# Patient Record
Sex: Female | Born: 1990 | Race: White | Hispanic: No | Marital: Married | Smoking: Never smoker
Health system: Southern US, Community
[De-identification: ages and names within clinical notes are randomized; demographics above are authoritative.]

## PROBLEM LIST (undated history)

## (undated) ENCOUNTER — Inpatient Hospital Stay (HOSPITAL_COMMUNITY): Payer: Self-pay

## (undated) DIAGNOSIS — Z789 Other specified health status: Secondary | ICD-10-CM

## (undated) DIAGNOSIS — U071 COVID-19: Secondary | ICD-10-CM

## (undated) HISTORY — PX: NO PAST SURGERIES: SHX2092

---

## 2013-10-05 ENCOUNTER — Encounter (HOSPITAL_COMMUNITY): Payer: Self-pay | Admitting: Medical

## 2013-10-05 ENCOUNTER — Inpatient Hospital Stay (HOSPITAL_COMMUNITY)
Admission: AD | Admit: 2013-10-05 | Discharge: 2013-10-05 | Disposition: A | Discharge status: Home / Self Care | Payer: Medicaid Other | Source: Ambulatory Visit | Attending: Family Medicine | Admitting: Family Medicine

## 2013-10-05 DIAGNOSIS — O219 Vomiting of pregnancy, unspecified: Secondary | ICD-10-CM

## 2013-10-05 DIAGNOSIS — O210 Mild hyperemesis gravidarum: Secondary | ICD-10-CM | POA: Insufficient documentation

## 2013-10-05 HISTORY — DX: Other specified health status: Z78.9

## 2013-10-05 LAB — URINALYSIS, ROUTINE W REFLEX MICROSCOPIC
Bilirubin Urine: NEGATIVE
Glucose, UA: NEGATIVE mg/dL
Hgb urine dipstick: NEGATIVE
Ketones, ur: NEGATIVE mg/dL
LEUKOCYTES UA: NEGATIVE
Nitrite: NEGATIVE
PH: 6.5 (ref 5.0–8.0)
Protein, ur: NEGATIVE mg/dL
SPECIFIC GRAVITY, URINE: 1.015 (ref 1.005–1.030)
Urobilinogen, UA: 0.2 mg/dL (ref 0.0–1.0)

## 2013-10-05 LAB — POCT PREGNANCY, URINE: Preg Test, Ur: POSITIVE — AB

## 2013-10-05 MED ORDER — PROMETHAZINE HCL 25 MG/ML IJ SOLN
12.5000 mg | Freq: Once | INTRAMUSCULAR | Status: AC
  Administered 2013-10-05: 12.5 mg via INTRAMUSCULAR
  Filled 2013-10-05: qty 1

## 2013-10-05 MED ORDER — PROMETHAZINE HCL 25 MG PO TABS: 25.0000 mg | ORAL_TABLET | Freq: Four times a day (QID) | ORAL | Status: DC | PRN

## 2013-10-05 NOTE — MAU Provider Note (Signed)
History     CSN: [id number]  Arrival date and time: 10/05/13 1141   First Provider Initiated Contact with Patient 10/05/13 1215      Chief Complaint  Patient presents with  . Emesis During Pregnancy   HPI Comments: [name] 23 y.o. G1P0 [redacted]w[redacted]d presents to MAU for nausea and vomiting x 3 weeks. She had a little bleeding from her throat when she vomited today. She has not had any vaginal bleeding or cramping. She has not lost any weight but gained 3 lbs. Her diet is [place] and rather spicy. She plans care at [facility]. She has been in this country for only 3 months and speaks Arabic. Her husband interprets for her.      Past Medical History  Diagnosis Date  . Medical history non-contributory     Past Surgical History  Procedure Laterality Date  . No past surgeries      History reviewed. No pertinent family history.  History  Substance Use Topics  . Smoking status: Never Smoker   . Smokeless tobacco: Not on file  . Alcohol Use: No    Allergies: No Known Allergies  Prescriptions prior to admission  Medication Sig Dispense Refill  . Prenatal Vit-Fe Fumarate-FA (PRENATAL MULTIVITAMIN) TABS tablet Take 1 tablet by mouth daily at 12 noon.        Review of Systems  Constitutional: Negative.   HENT: Negative.   Respiratory: Negative.   Cardiovascular: Negative.   Gastrointestinal: Positive for heartburn, nausea and vomiting. Negative for abdominal pain.  Genitourinary: Negative.   Skin: Negative.   Neurological: Negative.   Psychiatric/Behavioral: Negative.    Physical Exam   Blood pressure 99/57, pulse 69, temperature 98.2 F (36.8 C), temperature source Oral, resp. rate 16, height 5' 1.75" (1.568 m), weight 55.883 kg (123 lb 3.2 oz), last menstrual period 08/12/2013, SpO2 99.00%.  Physical Exam  Constitutional: She is oriented to person, place, and time. She appears well-developed and well-nourished. No distress.  HENT:  Head: Normocephalic and atraumatic.   Eyes: Pupils are equal, round, and reactive to light.  Neck: Normal range of motion. Neck supple.  Cardiovascular: Normal rate, regular rhythm and normal heart sounds.   Respiratory: Effort normal and breath sounds normal.  GI: Soft. Bowel sounds are normal.  Genitourinary:  Not examined  Musculoskeletal: Normal range of motion.  Neurological: She is alert and oriented to person, place, and time.  Skin: Skin is warm and dry.  Psychiatric: She has a normal mood and affect. Her behavior is normal. Judgment and thought content normal.   Results for orders placed during the hospital encounter of 10/05/13 (from the past 24 hour(s))  URINALYSIS, ROUTINE W REFLEX MICROSCOPIC     Status: None   Collection Time    10/05/13 11:50 AM      Result Value Ref Range   Color, Urine YELLOW  YELLOW   APPearance CLEAR  CLEAR   Specific Gravity, Urine 1.015  1.005 - 1.030   pH 6.5  5.0 - 8.0   Glucose, UA NEGATIVE  NEGATIVE mg/dL   Hgb urine dipstick NEGATIVE  NEGATIVE   Bilirubin Urine NEGATIVE  NEGATIVE   Ketones, ur NEGATIVE  NEGATIVE mg/dL   Protein, ur NEGATIVE  NEGATIVE mg/dL   Urobilinogen, UA 0.2  0.0 - 1.0 mg/dL   Nitrite NEGATIVE  NEGATIVE   Leukocytes, UA NEGATIVE  NEGATIVE  POCT PREGNANCY, URINE     Status: Abnormal   Collection Time    10/05/13 12:28 PM  Result Value Ref Range   Preg Test, Ur POSITIVE (*) NEGATIVE    MAU Course  Procedures  MDM  Phenergan 12.5 mg IM/ feeling much better  Assessment and Plan   A: Nausea and vomiting in early pregnancy  P: Phenergan 25 mg 1/2 tab q 6 hrs prn nausea  Decrease spicy foods  Use Flintstones for PNV till nausea passes Call [facility] for prenatal care   Carolynn Serve 10/05/2013, 12:40 PM

## 2013-10-05 NOTE — MAU Note (Signed)
No vaginal bleeding at all.  Pt has been having vomiting with pregnancy x 2 weeks.

## 2013-10-05 NOTE — Discharge Instructions (Signed)
Hyperemesis Gravidarum °Hyperemesis gravidarum is a severe form of nausea and vomiting that happens during pregnancy. Hyperemesis is worse than morning sickness. It may cause you to have nausea or vomiting all day for many days. It may keep you from eating and drinking enough food and liquids. Hyperemesis usually occurs during the first half (the first 20 weeks) of pregnancy. It often goes away once a woman is in her second half of pregnancy. However, sometimes hyperemesis continues through an entire pregnancy.  °CAUSES  °The cause of this condition is not completely known but is thought to be related to changes in the body's hormones when pregnant. It could be from the high level of the pregnancy hormone or an increase in estrogen in the body.  °SIGNS AND SYMPTOMS  °· Severe nausea and vomiting. °· Nausea that does not go away. °· Vomiting that does not allow you to keep any food down. °· Weight loss and body fluid loss (dehydration). °· Having no desire to eat or not liking food you have previously enjoyed. °DIAGNOSIS  °Your health care provider will do a physical exam and ask you about your symptoms. He or she may also order blood tests and urine tests to make sure something else is not causing the problem.  °TREATMENT  °You may only need medicine to control the problem. If medicines do not control the nausea and vomiting, you will be treated in the hospital to prevent dehydration, increased acid in the blood (acidosis), weight loss, and changes in the electrolytes in your body that may harm the unborn baby (fetus). You may need IV fluids.  °HOME CARE INSTRUCTIONS  °· Only take over-the-counter or prescription medicines as directed by your health care provider. °· Try eating a couple of dry crackers or toast in the morning before getting out of bed. °· Avoid foods and smells that upset your stomach. °· Avoid fatty and spicy foods. °· Eat 5-6 small meals a day. °· Do not drink when eating meals. Drink between  meals. °· For snacks, eat high-protein foods, such as cheese. °· Eat or suck on things that have ginger in them. Ginger helps nausea. °· Avoid food preparation. The smell of food can spoil your appetite. °· Avoid iron pills and iron in your multivitamins until after 3-4 months of being pregnant. However, consult with your health care provider before stopping any prescribed iron pills. °SEEK MEDICAL CARE IF:  °· Your abdominal pain increases. °· You have a severe headache. °· You have vision problems. °· You are losing weight. °SEEK IMMEDIATE MEDICAL CARE IF:  °· You are unable to keep fluids down. °· You vomit blood. °· You have constant nausea and vomiting. °· You have excessive weakness. °· You have extreme thirst. °· You have dizziness or fainting. °· You have a fever or persistent symptoms for more than 2-3 days. °· You have a fever and your symptoms suddenly get worse. °MAKE SURE YOU:  °· Understand these instructions. °· Will watch your condition. °· Will get help right away if you are not doing well or get worse. °Document Released: 01/18/2005 Document Revised: 11/08/2012 Document Reviewed: 08/30/2012 °ExitCare® Patient Information ©2015 [employer]. This information is not intended to replace advice given to you by your health care provider. Make sure you discuss any questions you have with your health care provider. ° °

## 2013-10-06 NOTE — MAU Provider Note (Signed)
Attestation of Attending Supervision of Advanced Practitioner (CNM/NP): Evaluation and management procedures were performed by the Advanced Practitioner under my supervision and collaboration. I have reviewed the Advanced Practitioner's note and chart, and I agree with the management and plan.  LEGGETT,KELLY H. 2:00 AM

## 2013-10-30 LAB — OB RESULTS CONSOLE HEPATITIS B SURFACE ANTIGEN: Hepatitis B Surface Ag: NEGATIVE

## 2013-10-30 LAB — OB RESULTS CONSOLE GC/CHLAMYDIA
Chlamydia: NEGATIVE
Gonorrhea: NEGATIVE

## 2013-10-30 LAB — OB RESULTS CONSOLE HIV ANTIBODY (ROUTINE TESTING): HIV: NONREACTIVE

## 2013-10-30 LAB — OB RESULTS CONSOLE ABO/RH: RH Type: POSITIVE

## 2013-10-30 LAB — OB RESULTS CONSOLE RUBELLA ANTIBODY, IGM: Rubella: IMMUNE

## 2013-10-30 LAB — OB RESULTS CONSOLE ANTIBODY SCREEN: Antibody Screen: NEGATIVE

## 2013-10-30 LAB — OB RESULTS CONSOLE RPR: RPR: NONREACTIVE

## 2013-12-04 ENCOUNTER — Encounter (HOSPITAL_COMMUNITY): Payer: Self-pay | Admitting: Medical

## 2014-02-01 NOTE — L&D Delivery Note (Signed)
Delivery Note At 12:17 PM a viable and healthy female was delivered via  (Presentation: ROA ; ROT - compound with Left hand  ).  APGAR: 9,9 ; weight 7#2 .   Placenta status: delivered, intact .  Cord: 3VC with the following complications: none .    Anesthesia: Epidural  Episiotomy:  none Lacerations:  2nd degree, periclitoral Suture Repair: 3.0 vicryl rapide Est. Blood Loss (mL):  400cc  Mom to postpartum.  Baby to Couplet care / Skin to Skin.  Bovard-Stuckert, Amro Winebarger 05/11/2014, 12:37 PM  Br/O+/RI/Tdap in PNC/Contra?  Will d/w pt circumcision with interpreter.

## 2014-04-15 LAB — OB RESULTS CONSOLE GBS: GBS: NEGATIVE

## 2014-05-09 ENCOUNTER — Encounter (HOSPITAL_COMMUNITY): Payer: Self-pay | Admitting: *Deleted

## 2014-05-09 ENCOUNTER — Telehealth (HOSPITAL_COMMUNITY): Payer: Self-pay | Admitting: *Deleted

## 2014-05-09 NOTE — Telephone Encounter (Signed)
Interpreter number [phone]

## 2014-05-09 NOTE — Telephone Encounter (Signed)
Preadmission screen Interpreter number  

## 2014-05-11 ENCOUNTER — Inpatient Hospital Stay (HOSPITAL_COMMUNITY): Payer: Medicaid Other | Admitting: Anesthesiology

## 2014-05-11 ENCOUNTER — Encounter (HOSPITAL_COMMUNITY): Payer: Self-pay | Admitting: *Deleted

## 2014-05-11 ENCOUNTER — Inpatient Hospital Stay (HOSPITAL_COMMUNITY)
Admission: AD | Admit: 2014-05-11 | Discharge: 2014-05-13 | DRG: 775 | Disposition: A | Discharge status: Home / Self Care | Payer: Medicaid Other | Source: Ambulatory Visit | Attending: Obstetrics and Gynecology | Admitting: Obstetrics and Gynecology

## 2014-05-11 DIAGNOSIS — Z3A39 39 weeks gestation of pregnancy: Secondary | ICD-10-CM | POA: Diagnosis present

## 2014-05-11 DIAGNOSIS — O42013 Preterm premature rupture of membranes, onset of labor within 24 hours of rupture, third trimester: Secondary | ICD-10-CM | POA: Diagnosis present

## 2014-05-11 DIAGNOSIS — O9989 Other specified diseases and conditions complicating pregnancy, childbirth and the puerperium: Secondary | ICD-10-CM | POA: Diagnosis present

## 2014-05-11 LAB — CBC
HCT: 34.4 % — ABNORMAL LOW (ref 36.0–46.0)
Hemoglobin: 12.2 g/dL (ref 12.0–15.0)
MCH: 31.8 pg (ref 26.0–34.0)
MCHC: 35.5 g/dL (ref 30.0–36.0)
MCV: 89.6 fL (ref 78.0–100.0)
Platelets: 204 10*3/uL (ref 150–400)
RBC: 3.84 MIL/uL — ABNORMAL LOW (ref 3.87–5.11)
RDW: 13.1 % (ref 11.5–15.5)
WBC: 12.4 10*3/uL — AB (ref 4.0–10.5)

## 2014-05-11 LAB — TYPE AND SCREEN
ABO/RH(D): O POS
ANTIBODY SCREEN: NEGATIVE

## 2014-05-11 LAB — RPR: RPR Ser Ql: NONREACTIVE

## 2014-05-11 LAB — ABO/RH: ABO/RH(D): O POS

## 2014-05-11 MED ORDER — LACTATED RINGERS IV SOLN
500.0000 mL | Freq: Once | INTRAVENOUS | Status: AC
  Administered 2014-05-11: 500 mL via INTRAVENOUS

## 2014-05-11 MED ORDER — ACETAMINOPHEN 325 MG PO TABS: 650.0000 mg | ORAL_TABLET | ORAL | Status: DC | PRN

## 2014-05-11 MED ORDER — LACTATED RINGERS IV SOLN: INTRAVENOUS | Status: DC

## 2014-05-11 MED ORDER — LIDOCAINE HCL (PF) 1 % IJ SOLN
30.0000 mL | INTRAMUSCULAR | Status: DC | PRN
  Filled 2014-05-11: qty 30

## 2014-05-11 MED ORDER — OXYCODONE-ACETAMINOPHEN 5-325 MG PO TABS: 2.0000 | ORAL_TABLET | ORAL | Status: DC | PRN

## 2014-05-11 MED ORDER — BENZOCAINE-MENTHOL 20-0.5 % EX AERO
1.0000 "application " | INHALATION_SPRAY | CUTANEOUS | Status: DC | PRN
  Administered 2014-05-12 – 2014-05-13 (×2): 1 via TOPICAL
  Filled 2014-05-11 (×2): qty 56

## 2014-05-11 MED ORDER — OXYCODONE-ACETAMINOPHEN 5-325 MG PO TABS: 1.0000 | ORAL_TABLET | ORAL | Status: DC | PRN

## 2014-05-11 MED ORDER — FENTANYL 2.5 MCG/ML BUPIVACAINE 1/10 % EPIDURAL INFUSION (WH - ANES)
INTRAMUSCULAR | Status: DC | PRN
  Administered 2014-05-11: 14 mL/h via EPIDURAL

## 2014-05-11 MED ORDER — CITRIC ACID-SODIUM CITRATE 334-500 MG/5ML PO SOLN
30.0000 mL | ORAL | Status: DC | PRN
  Filled 2014-05-11: qty 15

## 2014-05-11 MED ORDER — ONDANSETRON HCL 4 MG PO TABS: 4.0000 mg | ORAL_TABLET | ORAL | Status: DC | PRN

## 2014-05-11 MED ORDER — LIDOCAINE HCL (PF) 1 % IJ SOLN
INTRAMUSCULAR | Status: DC | PRN
  Administered 2014-05-11 (×2): 5 mL

## 2014-05-11 MED ORDER — LANOLIN HYDROUS EX OINT: TOPICAL_OINTMENT | CUTANEOUS | Status: DC | PRN

## 2014-05-11 MED ORDER — SIMETHICONE 80 MG PO CHEW
80.0000 mg | CHEWABLE_TABLET | ORAL | Status: DC | PRN
  Administered 2014-05-11: 80 mg via ORAL

## 2014-05-11 MED ORDER — ZOLPIDEM TARTRATE 5 MG PO TABS: 5.0000 mg | ORAL_TABLET | Freq: Every evening | ORAL | Status: DC | PRN

## 2014-05-11 MED ORDER — PHENYLEPHRINE 40 MCG/ML (10ML) SYRINGE FOR IV PUSH (FOR BLOOD PRESSURE SUPPORT)
80.0000 ug | PREFILLED_SYRINGE | INTRAVENOUS | Status: DC | PRN
  Filled 2014-05-11: qty 2

## 2014-05-11 MED ORDER — OXYTOCIN BOLUS FROM INFUSION: 500.0000 mL | INTRAVENOUS | Status: DC

## 2014-05-11 MED ORDER — ONDANSETRON HCL 4 MG/2ML IJ SOLN: 4.0000 mg | INTRAMUSCULAR | Status: DC | PRN

## 2014-05-11 MED ORDER — ONDANSETRON HCL 4 MG/2ML IJ SOLN
4.0000 mg | Freq: Four times a day (QID) | INTRAMUSCULAR | Status: DC | PRN
Start: 2014-05-11 — End: 2014-05-11

## 2014-05-11 MED ORDER — DIPHENHYDRAMINE HCL 25 MG PO CAPS: 25.0000 mg | ORAL_CAPSULE | Freq: Four times a day (QID) | ORAL | Status: DC | PRN

## 2014-05-11 MED ORDER — EPHEDRINE 5 MG/ML INJ
10.0000 mg | INTRAVENOUS | Status: DC | PRN
  Filled 2014-05-11: qty 2

## 2014-05-11 MED ORDER — BUTORPHANOL TARTRATE 1 MG/ML IJ SOLN
1.0000 mg | INTRAMUSCULAR | Status: DC | PRN
  Administered 2014-05-11: 1 mg via INTRAVENOUS
  Filled 2014-05-11: qty 1

## 2014-05-11 MED ORDER — FENTANYL 2.5 MCG/ML BUPIVACAINE 1/10 % EPIDURAL INFUSION (WH - ANES)
14.0000 mL/h | INTRAMUSCULAR | Status: DC | PRN
  Administered 2014-05-11: 14 mL/h via EPIDURAL
  Filled 2014-05-11: qty 125

## 2014-05-11 MED ORDER — LACTATED RINGERS IV SOLN: 500.0000 mL | INTRAVENOUS | Status: DC | PRN

## 2014-05-11 MED ORDER — DIPHENHYDRAMINE HCL 50 MG/ML IJ SOLN: 12.5000 mg | INTRAMUSCULAR | Status: DC | PRN

## 2014-05-11 MED ORDER — DIBUCAINE 1 % RE OINT: 1.0000 "application " | TOPICAL_OINTMENT | RECTAL | Status: DC | PRN

## 2014-05-11 MED ORDER — PRENATAL MULTIVITAMIN CH
1.0000 | ORAL_TABLET | Freq: Every day | ORAL | Status: DC
  Administered 2014-05-12 – 2014-05-13 (×2): 1 via ORAL
  Filled 2014-05-11 (×2): qty 1

## 2014-05-11 MED ORDER — PHENYLEPHRINE 40 MCG/ML (10ML) SYRINGE FOR IV PUSH (FOR BLOOD PRESSURE SUPPORT)
80.0000 ug | PREFILLED_SYRINGE | INTRAVENOUS | Status: DC | PRN
  Filled 2014-05-11: qty 2
  Filled 2014-05-11: qty 20

## 2014-05-11 MED ORDER — FLEET ENEMA 7-19 GM/118ML RE ENEM: 1.0000 | ENEMA | RECTAL | Status: DC | PRN

## 2014-05-11 MED ORDER — TERBUTALINE SULFATE 1 MG/ML IJ SOLN
0.2500 mg | Freq: Once | INTRAMUSCULAR | Status: DC | PRN
  Filled 2014-05-11: qty 1

## 2014-05-11 MED ORDER — SENNOSIDES-DOCUSATE SODIUM 8.6-50 MG PO TABS
2.0000 | ORAL_TABLET | ORAL | Status: DC
  Administered 2014-05-11 – 2014-05-13 (×2): 2 via ORAL
  Filled 2014-05-11 (×2): qty 2

## 2014-05-11 MED ORDER — OXYTOCIN 40 UNITS IN LACTATED RINGERS INFUSION - SIMPLE MED
1.0000 m[IU]/min | INTRAVENOUS | Status: DC
  Administered 2014-05-11: 2 m[IU]/min via INTRAVENOUS
  Filled 2014-05-11: qty 1000

## 2014-05-11 MED ORDER — IBUPROFEN 600 MG PO TABS
600.0000 mg | ORAL_TABLET | Freq: Four times a day (QID) | ORAL | Status: DC
  Administered 2014-05-11 – 2014-05-13 (×7): 600 mg via ORAL
  Filled 2014-05-11 (×8): qty 1

## 2014-05-11 MED ORDER — LACTATED RINGERS IV SOLN
INTRAVENOUS | Status: DC
  Administered 2014-05-11 (×3): via INTRAVENOUS

## 2014-05-11 MED ORDER — WITCH HAZEL-GLYCERIN EX PADS: 1.0000 "application " | MEDICATED_PAD | CUTANEOUS | Status: DC | PRN

## 2014-05-11 MED ORDER — OXYTOCIN 40 UNITS IN LACTATED RINGERS INFUSION - SIMPLE MED
62.5000 mL/h | INTRAVENOUS | Status: DC
  Administered 2014-05-11: 62.5 mL/h via INTRAVENOUS

## 2014-05-11 NOTE — Progress Notes (Signed)
[facility] to BS via w/c for further eval of SROM.

## 2014-05-11 NOTE — MAU Note (Signed)
Leaking fld since Thurs night. More today and esp last 4 hours. Like water with some blood in it. Some mild contractions

## 2014-05-11 NOTE — H&P (Signed)
Valerie Holmes is a 24 y.o. female G1P0  At 39+ with ROM for copious clear fluid.  +FM,  LOF - clear fluid, no VB,  occ ctx.  Relatively uncomplicated PNC.  Language barrier  Maternal Medical History:  Reason for admission: Rupture of membranes.   Contractions: Frequency: irregular.    Fetal activity: Perceived fetal activity is normal.    Prenatal complications: no prenatal complications Prenatal Complications - Diabetes: none.    OB History    Gravida Para Term Preterm AB TAB SAB Ectopic Multiple Living   1         0    G1 present No abn pap No STD  Past Medical History  Diagnosis Date  . Medical history non-contributory    Past Surgical History  Procedure Laterality Date  . No past surgeries     Family History: family history is negative for Alcohol abuse, Arthritis, Asthma, Birth defects, Cancer, COPD, Depression, Diabetes, Drug abuse, Early death, Hearing loss, Heart disease, Hyperlipidemia, Hypertension, Kidney disease, Learning disabilities, Mental illness, Mental retardation, Miscarriages / Stillbirths, Stroke, Vision loss, and Varicose Veins. Social History:  reports that she has never smoked. She has never used smokeless tobacco. She reports that she does not drink alcohol or use illicit drugs.married Meds PNV All NKDA   Prenatal Transfer Tool  Maternal Diabetes: No Genetic Screening: Normal Maternal Ultrasounds/Referrals: Normal Fetal Ultrasounds or other Referrals:  None Maternal Substance Abuse:  No Significant Maternal Medications:  None Significant Maternal Lab Results:  Lab values include: Group B Strep negative Other Comments:  language barrier  Review of Systems  Constitutional: Negative.   HENT: Negative.   Eyes: Negative.   Respiratory: Negative.   Cardiovascular: Negative.   Gastrointestinal: Negative.   Genitourinary: Negative.   Musculoskeletal: Negative.   Skin: Negative.   Neurological: Negative.   Psychiatric/Behavioral: Negative.      Dilation: 4.5 Effacement (%): 90 Station: -1 Exam by:: m wilkins rnc Blood pressure 116/63, pulse 75, temperature 98.7 F (37.1 C), temperature source Oral, resp. rate 18, height 5' 2.5" (1.588 m), weight 73.12 kg (161 lb 3.2 oz), last menstrual period 08/12/2013. Maternal Exam:  Abdomen: Patient reports no abdominal tenderness. Fundal height is appropriate for gestation.   Estimated fetal weight is 6-7#.   Fetal presentation: vertex  Introitus: Normal vulva. Normal vagina.  Pelvis: adequate for delivery.   Cervix: Cervix evaluated by digital exam.     Physical Exam  Constitutional: She is oriented to person, place, and time. She appears well-developed and well-nourished.  HENT:  Head: Normocephalic and atraumatic.  Cardiovascular: Normal rate and regular rhythm.   Respiratory: Effort normal and breath sounds normal. No respiratory distress. She has no wheezes.  GI: Soft. Bowel sounds are normal. She exhibits no distension. There is no tenderness.  Musculoskeletal: Normal range of motion.  Neurological: She is alert and oriented to person, place, and time.  Skin: Skin is warm and dry.  Psychiatric: She has a normal mood and affect. Her behavior is normal.    Prenatal labs: ABO, Rh: --/--/O POS, O POS (04/09 0140) Antibody: NEG (04/09 0140) Rubella: Immune (09/29 0000) RPR: Nonreactive (09/29 0000)  HBsAg: Negative (09/29 0000)  HIV: Non-reactive (09/29 0000)  GBS: Negative (03/14 0000)   Hgb 13.3/Plt 191K/Ur Cx neg/ GC neg/Chl neg/Pap WNL/CF neg/ First Tri and AFP WNL/glucola 84/GBBS neg  [place] dated by 12 wk [place] nl anat/post plac/female  Tdap 02/19/14, Flu 11/27/13  Assessment/Plan: 24yo G1P0 at 39+ with ROM,  augmentation of labor with pitocin Expect SVD gbbs neg   Bovard-Stuckert, Chia Rock 05/11/2014, 7:58 AM

## 2014-05-11 NOTE — Progress Notes (Signed)
Patient ID: Valerie Holmes, female   DOB: 02/01/1991, 24 y.o.   MRN: [id number]  CTSP secondary to repetitive/prolonged variables.  SVE 5-6/90/-1  IUPC placed without complication for possible amniinfusion  140-150 mod var, category 2, close monitoring Ctx 2-844min mVu not appearing adequate  Will monitor closely

## 2014-05-11 NOTE — Lactation Note (Signed)
This note was copied from the chart of Valerie Holmes. Lactation Consultation Note  Patient Name: Valerie Atilano Inal Yariana Today's Date: 05/11/2014 Reason for consult: Initial assessment of this mom and baby at 5 hours pp.This is mom's first baby and she speaks Arabic but FOB and a female friend (experienced breastfeeder) are at bedside and able to translate from [place] to Arabic.  Baby is asleep in open crib on his back with no cuing and mom has room full of visitors right now.  LC discussed cue feedings, importance of frequent STS and hand expression.  Mom and FOB verify that their nurse has shown hand expression and the female friend reassures her that colostrum is sufficient for newborn. LC informs her that newborn sleepiness is normal during first few days.  LC recommends unwrapping baby at least q3h and attempting to breastfeed with STS, which may awaken baby and the placing STS will stimulate maternal milk-producing hormones.  Mom encouraged to feed baby 8-12 times/24 hours and with feeding cues. LC encouraged review of Baby and Me pp 9, 14 and 20-25 for STS and BF information. LC provided [facility] Resource brochure and reviewed [facility] services and list of community and web site resources.    Maternal Data Formula Feeding for Exclusion: No Has patient been taught Hand Expression?: Yes (mom states that her nurse has shown her how to hand express milk)  Feeding Feeding Type: Breast Fed  LATCH Score/Interventions           no LATCH yet but several attempts since birth; baby sleepy           Lactation Tools Discussed/Used   STS, cue feedings, hand expression  Consult Status Consult Status: Follow-up Date: 05/12/14 Follow-up type: In-patient    Warrick ParisianBryant, Jamelah Sitzer [facility] 05/11/2014, 5:27 PM

## 2014-05-11 NOTE — Anesthesia Procedure Notes (Signed)
Epidural Patient location during procedure: OB  Staffing Anesthesiologist: Sebastian AcheMANNY, Codi Folkerts Performed by: anesthesiologist   Preanesthetic Checklist Completed: patient identified, site marked, surgical consent, pre-op evaluation, timeout performed, IV checked, risks and benefits discussed and monitors and equipment checked  Epidural Patient position: sitting Prep: DuraPrep Patient monitoring: heart rate, continuous pulse ox and blood pressure Approach: midline Location: L2-L3 Injection technique: LOR air  Needle:  Needle type: Tuohy  Needle gauge: 18 G Needle length: 9 cm and 9 Needle insertion depth: 6 cm Catheter type: closed end flexible Catheter size: 20 Guage Test dose: negative  Assessment Events: blood not aspirated, injection not painful, no injection resistance, negative IV test and no paresthesia  Additional Notes   Patient tolerated the insertion well without complications.Reason for block:procedure for pain

## 2014-05-11 NOTE — Anesthesia Preprocedure Evaluation (Signed)
Anesthesia Evaluation  Patient identified by MRN, date of birth, ID band Patient awake and Patient confused    Reviewed: Allergy & Precautions, H&P , NPO status , Patient's Chart, lab work & pertinent test results  Airway Mallampati: II       Dental  (+) Teeth Intact   Pulmonary neg pulmonary ROS,  breath sounds clear to auscultation  Pulmonary exam normal       Cardiovascular Exercise Tolerance: Good Rhythm:regular Rate:Normal     Neuro/Psych    GI/Hepatic negative GI ROS, Neg liver ROS,   Endo/Other    Renal/GU      Musculoskeletal   Abdominal Normal abdominal exam  (+)   Peds  Hematology   Anesthesia Other Findings   Reproductive/Obstetrics (+) Pregnancy                             Anesthesia Physical Anesthesia Plan  ASA: II  Anesthesia Plan: Epidural   Post-op Pain Management:    Induction:   Airway Management Planned:   Additional Equipment:   Intra-op Plan:   Post-operative Plan:   Informed Consent: I have reviewed the patients History and Physical, chart, labs and discussed the procedure including the risks, benefits and alternatives for the proposed anesthesia with the patient or authorized representative who has indicated his/her understanding and acceptance.     Plan Discussed with:   Anesthesia Plan Comments:         Anesthesia Quick Evaluation

## 2014-05-12 LAB — CBC
HCT: 30.9 % — ABNORMAL LOW (ref 36.0–46.0)
Hemoglobin: 10.7 g/dL — ABNORMAL LOW (ref 12.0–15.0)
MCH: 31.8 pg (ref 26.0–34.0)
MCHC: 34.6 g/dL (ref 30.0–36.0)
MCV: 92 fL (ref 78.0–100.0)
Platelets: 166 10*3/uL (ref 150–400)
RBC: 3.36 MIL/uL — ABNORMAL LOW (ref 3.87–5.11)
RDW: 13.5 % (ref 11.5–15.5)
WBC: 15.4 10*3/uL — ABNORMAL HIGH (ref 4.0–10.5)

## 2014-05-12 NOTE — Progress Notes (Signed)
Post Partum Day 1 Subjective: no complaints, up ad lib, voiding, tolerating PO and nl lochia, pain controlled  Objective: Blood pressure 110/55, pulse 79, temperature 98.2 F (36.8 C), temperature source Oral, resp. rate 20, height 5' 2.5" (1.588 m), weight 73.12 kg (161 lb 3.2 oz), last menstrual period 08/12/2013, unknown if currently breastfeeding.  Physical Exam:  General: alert and no distress Lochia: appropriate Uterine Fundus: firm}   Recent Labs  05/11/14 0140 05/12/14 0608  HGB 12.2 10.7*  HCT 34.4* 30.9*    Assessment/Plan: Plan for discharge tomorrow.  Routine postpartum care.     LOS: 1 day   Bovard-Stuckert, Tzipora Mcinroy 05/12/2014, 8:43 AM

## 2014-05-12 NOTE — Anesthesia Postprocedure Evaluation (Signed)
Anesthesia Post Note  Patient: Valerie Holmes  Procedure(s) Performed: * No procedures listed *  Anesthesia type: Epidural  Patient location: Mother/Baby  Post pain: Pain level controlled  Post assessment: Post-op Vital signs reviewed  Last Vitals:  Filed Vitals:   05/12/14 0611  BP: 110/55  Pulse: 79  Temp: 36.8 C  Resp: 20    Post vital signs: Reviewed  Level of consciousness:alert  Complications: No apparent anesthesia complications

## 2014-05-12 NOTE — Lactation Note (Addendum)
This note was copied from the chart of Valerie Holmes. Lactation Consultation Note  Marcelino DusterMichelle RN asked for assistance w/ breastfeeding since baby has had short feedings and has had difficulty maintaining latch.  FOB interpreting.  Language arabic. Undressed baby and encouraged lots of STS.   Hand expressed a few drops of colostrom into spoon and gave to baby. Repositioned mother to side lying.  Reviewed how to massage breasts during feeding. Baby latched, sucks and swallows observed, some with stimulation.  Parents happy. Encouraged longer feedings, more than 10 min. Reminded them that when he slows down on one breast or falls asleep, burp him and feed on other breast. Observed feeding for more than 15 min. Reviewed w/ parents the importance of STS, undressing baby and hand expressing before latching. Michelle RN set up DEBP since baby has only been feeding short periods. Suggest if feeding difficulty continues mother should use DEBP.    Patient Name: [name]'XToday's Date: 05/12/2014 Reason for consult: Follow-up assessment   Maternal Data    Feeding Feeding Type: Breast Fed Length of feed: 5 min  LATCH Score/Interventions Latch: Repeated attempts needed to sustain latch, nipple held in mouth throughout feeding, stimulation needed to elicit sucking reflex. Intervention(s): Waking techniques;Skin to skin Intervention(s): Breast massage;Assist with latch;Adjust position  Audible Swallowing: A few with stimulation Intervention(s): Skin to skin;Hand expression  Type of Nipple: Everted at rest and after stimulation  Comfort (Breast/Nipple): Soft / non-tender     Hold (Positioning): Assistance needed to correctly position infant at breast and maintain latch. Intervention(s): Support Pillows;Position options;Skin to skin;Breastfeeding basics reviewed  LATCH Score: 7  Lactation Tools Discussed/Used Tools: Pump Breast pump type: Manual   Consult Status Consult  Status: Follow-up Date: 05/13/14 Follow-up type: In-patient    Dahlia ByesBerkelhammer, Girolamo Lortie [facility] 05/12/2014, 2:22 PM

## 2014-05-13 ENCOUNTER — Ambulatory Visit: Payer: Self-pay

## 2014-05-13 MED ORDER — IBUPROFEN 800 MG PO TABS: 800.0000 mg | ORAL_TABLET | Freq: Three times a day (TID) | ORAL | Status: DC | PRN

## 2014-05-13 MED ORDER — PRENATAL MULTIVITAMIN CH
1.0000 | ORAL_TABLET | Freq: Every day | ORAL | Status: AC
Start: 2014-05-13 — End: ?

## 2014-05-13 MED ORDER — OXYCODONE-ACETAMINOPHEN 5-325 MG PO TABS: 1.0000 | ORAL_TABLET | Freq: Four times a day (QID) | ORAL | Status: DC | PRN

## 2014-05-13 NOTE — Progress Notes (Signed)
Post Partum Day 2 Subjective: no complaints, up ad lib, voiding, tolerating PO and nl lochia, pain controlled  Objective: Blood pressure 102/68, pulse 96, temperature 98.1 F (36.7 C), temperature source Oral, resp. rate 18, height 5' 2.5" (1.588 m), weight 73.12 kg (161 lb 3.2 oz), last menstrual period 08/12/2013, SpO2 100 %, unknown if currently breastfeeding.  Physical Exam:  General: alert and no distress Lochia: appropriate Uterine Fundus: firm  Recent Labs  05/11/14 0140 05/12/14 0608  HGB 12.2 10.7*  HCT 34.4* 30.9*    Assessment/Plan: Discharge home.  Routine care.  D/c with Motrin, percocet and PNV, f/u 6 weeks   LOS: 2 days   Bovard-Stuckert, Kelwin Gibler 05/13/2014, 8:54 AM

## 2014-05-13 NOTE — Lactation Note (Signed)
This note was copied from the chart of Valerie El Jennae. Lactation Consultation Note: Observed mother with infant latched on the left breast . Infant observed with audible swallows for 15 mins. Mothers right nipple is semi flat. Assist mother with firming nipple and using hand pump to firm nipple. Assist mother with football hold. Infant sustained latch for another 20 mins. Father of baby interpreted all teaching. Reviewed hand expression. Mother has large volume of colostrum. Reviewed baby and me book on treatment for engorgement. Advised mother in breastfeeding infant 8-12 times in 24 hours and with feeding cues. Mother has a hand pump and advised to pre-pump to soften breast before latching as needed. [facility] . She is active with WIC. Mother to follow up with Peds in am for weight check. Mother is aware of available LC services and community support.  Patient Name: Valerie Holmes Inal Jolynne [id number]'s Date: 05/13/2014     Maternal Data    Feeding Feeding Type: Breast Fed Length of feed: 10 min  LATCH Score/Interventions                      Lactation Tools Discussed/Used     Consult Status      Michel BickersKendrick, Zenon Leaf McCoy 05/13/2014, 3:12 PM

## 2014-05-13 NOTE — Progress Notes (Signed)
Ur chart review completed.  

## 2014-05-13 NOTE — Discharge Summary (Signed)
Obstetric Discharge Summary Reason for Admission: onset of labor Prenatal Procedures: none Intrapartum Procedures: spontaneous vaginal delivery Postpartum Procedures: none Complications-Operative and Postpartum: 2nd  degree perineal laceration and vaginal laceration HEMOGLOBIN  Date Value Ref Range Status  05/12/2014 10.7* 12.0 - 15.0 g/dL Final   HCT  Date Value Ref Range Status  05/12/2014 30.9* 36.0 - 46.0 % Final    Physical Exam:  General: alert and no distress Lochia: appropriate Uterine Fundus: firm  Discharge Diagnoses: Term Pregnancy-delivered  Discharge Information: Date: 05/13/2014 Activity: pelvic rest Diet: routine Medications: PNV, Ibuprofen and Percocet Condition: stable Instructions: refer to practice specific booklet Discharge to: home Follow-up Information    Follow up with Bovard-Stuckert, Augusto GambleJody, MD In 6 weeks.   Specialty:  Obstetrics and Gynecology   Why:  for postpartum check   Contact information:   [address] [place] [id number] [phone]       Newborn Data: Live born female  Birth Weight: 7 lb 2.3 oz (3240 g) APGAR: 9, 9  Home with mother.  Bovard-Stuckert, Md Smola 05/13/2014, 9:13 AM

## 2014-05-19 ENCOUNTER — Inpatient Hospital Stay (HOSPITAL_COMMUNITY): Admission: RE | Admit: 2014-05-19 | Payer: Medicaid Other | Source: Ambulatory Visit

## 2014-05-24 ENCOUNTER — Encounter (HOSPITAL_COMMUNITY): Payer: Self-pay | Admitting: *Deleted

## 2014-10-15 ENCOUNTER — Ambulatory Visit (INDEPENDENT_AMBULATORY_CARE_PROVIDER_SITE_OTHER): Payer: Self-pay | Admitting: Family Medicine

## 2014-10-15 VITALS — BP 120/66 | HR 91 | Temp 99.0°F | Resp 20 | Ht 63.2 in | Wt 146.2 lb

## 2014-10-15 DIAGNOSIS — K5900 Constipation, unspecified: Secondary | ICD-10-CM

## 2014-10-15 DIAGNOSIS — K648 Other hemorrhoids: Secondary | ICD-10-CM

## 2014-10-15 DIAGNOSIS — Z975 Presence of (intrauterine) contraceptive device: Secondary | ICD-10-CM

## 2014-10-15 LAB — POCT WET PREP WITH KOH
Clue Cells Wet Prep HPF POC: NEGATIVE
KOH Prep POC: NEGATIVE
Trichomonas, UA: NEGATIVE
YEAST WET PREP PER HPF POC: NEGATIVE

## 2014-10-15 MED ORDER — HYDROCORTISONE ACETATE 25 MG RE SUPP: RECTAL | Status: DC

## 2014-10-15 MED ORDER — HYDROCORTISONE ACETATE 25 MG RE SUPP: 25.0000 mg | Freq: Two times a day (BID) | RECTAL | Status: DC | PRN

## 2014-10-15 MED ORDER — POLYETHYLENE GLYCOL 3350 17 GM/SCOOP PO POWD: 17.0000 g | Freq: Every day | ORAL | Status: DC

## 2014-10-15 NOTE — Progress Notes (Signed)
Subjective:  This chart was scribed for Norberto Sorenson MD, by Veverly Fells, at [facility].  This patient was seen in room 14 and the patient's care was started at 2:49 PM.    Patient ID: Valerie Holmes, female    DOB: Feb 23, 1990, 24 y.o.   MRN: [id number] Chief Complaint  Patient presents with  . Hemorrhoids    having lots of rectal pain, bleeding at times    HPI  HPI Comments: Valerie Holmes is a 24 y.o. female who presents to the [facility] complaining of rectal pain and bleeding. She has a lot of pain during her bowel movements and has constipation.  Patient states that she is having decreased bowel movements as well.  Patient has never had problems with hemorrhoids in the past.  She has not yet used any over the counter medications.     Patient gave birth recently.     No past medical history on file.  No current outpatient prescriptions on file prior to visit.   No current facility-administered medications on file prior to visit.    No Known Allergies  Review of Systems  Constitutional: Negative for fever and chills.  Eyes: Negative for pain, discharge, redness and itching.  Respiratory: Negative for cough and shortness of breath.   Gastrointestinal: Positive for constipation, anal bleeding and rectal pain. Negative for nausea, vomiting and diarrhea.  Musculoskeletal: Negative for neck pain and neck stiffness.       Objective:   Physical Exam  Constitutional: She appears well-developed and well-nourished. No distress.  HENT:  Head: Normocephalic and atraumatic.  Eyes: Pupils are equal, round, and reactive to light.  Pulmonary/Chest: Effort normal. No respiratory distress.  Genitourinary:  some mild tenderness cevix with mild erythema, mucoid vaginal discharge iud string visible, no iud base visible.  cervix in OS normal with palpation  Two small non thrombosed internal hemorrhoids at proximally 5 o'clock and 12  oclock position.  No external hemorrhoids  Anal tone normal No gross blood seen on exam.    Musculoskeletal: Normal range of motion.  Neurological: She is alert.  Skin: Skin is warm and dry.  Psychiatric: She has a normal mood and affect. Her behavior is normal.  Nursing note and vitals reviewed.   Filed Vitals:   10/15/14 1439  BP: 120/66  Pulse: 91  Temp: 99 F (37.2 C)  TempSrc: Oral  Resp: 20  Height: 5' 3.2" (1.605 m)  Weight: 146 lb 4 oz (66.339 kg)  SpO2: 98%   Results for orders placed or performed in visit on 10/15/14  POCT Wet Prep with KOH  Result Value Ref Range   Trichomonas, UA Negative    Clue Cells Wet Prep HPF POC neg    Epithelial Wet Prep HPF POC Many Few, Moderate, Many   Yeast Wet Prep HPF POC neg    Bacteria Wet Prep HPF POC Few None, Few   RBC Wet Prep HPF POC 0-1    WBC Wet Prep HPF POC 4-6    KOH Prep POC Negative        Assessment & Plan:   1. Internal hemorrhoid, bleeding - exam reassuring, no gross abnormality seen, can try anusol after BM, reminded pt to self reduce any extruding hemorrhoids.  2. IUD (intrauterine device) in place - pt reassured strings visible. Placed sev mos ago.  3. Constipation, unspecified constipation type -start miralax    Orders Placed This Encounter  Procedures  . POCT  Wet Prep with KOH    Meds ordered this encounter  Medications  . DISCONTD: hydrocortisone (ANUSOL-HC) 25 MG suppository    Sig: Place 1 suppository (25 mg total) rectally 2 (two) times daily as needed for hemorrhoids. Apply after each bowel movement until pain and bleeding resolve.    Dispense:  12 suppository    Refill:  3  . polyethylene glycol powder (GLYCOLAX/MIRALAX) powder    Sig: Take 17 g by mouth daily.    Dispense:  250 g    Refill:  11  . hydrocortisone (ANUSOL-HC) 25 MG suppository    Sig: Insert into rectum after each bowel movement up to twice a day until pain and bleeding resolve.    Dispense:  12 suppository    Refill:  3     I personally performed the services described in this documentation, which was scribed in my presence. The recorded information has been reviewed and considered, and addended by me as needed.  Norberto Sorenson, MD MPH

## 2014-10-15 NOTE — Patient Instructions (Signed)
Hemorrhoids Hemorrhoids are swollen veins around the rectum or anus. There are two types of hemorrhoids:   Internal hemorrhoids. These occur in the veins just inside the rectum. They may poke through to the outside and become irritated and painful.  External hemorrhoids. These occur in the veins outside the anus and can be felt as a painful swelling or hard lump near the anus. CAUSES  Pregnancy.   Obesity.   Constipation or diarrhea.   Straining to have a bowel movement.   Sitting for long periods on the toilet.  Heavy lifting or other activity that caused you to strain.  Anal intercourse. SYMPTOMS   Pain.   Anal itching or irritation.   Rectal bleeding.   Fecal leakage.   Anal swelling.   One or more lumps around the anus.  DIAGNOSIS  Your caregiver may be able to diagnose hemorrhoids by visual examination. Other examinations or tests that may be performed include:   Examination of the rectal area with a gloved hand (digital rectal exam).   Examination of anal canal using a small tube (scope).   A blood test if you have lost a significant amount of blood.  A test to look inside the colon (sigmoidoscopy or colonoscopy). TREATMENT Most hemorrhoids can be treated at home. However, if symptoms do not seem to be getting better or if you have a lot of rectal bleeding, your caregiver may perform a procedure to help make the hemorrhoids get smaller or remove them completely. Possible treatments include:   Placing a rubber band at the base of the hemorrhoid to cut off the circulation (rubber band ligation).   Injecting a chemical to shrink the hemorrhoid (sclerotherapy).   Using a tool to burn the hemorrhoid (infrared light therapy).   Surgically removing the hemorrhoid (hemorrhoidectomy).   Stapling the hemorrhoid to block blood flow to the tissue (hemorrhoid stapling).  HOME CARE INSTRUCTIONS   Eat foods with fiber, such as whole grains, beans,  nuts, fruits, and vegetables. Ask your doctor about taking products with added fiber in them (fibersupplements).  Increase fluid intake. Drink enough water and fluids to keep your urine clear or pale yellow.   Exercise regularly.   Go to the bathroom when you have the urge to have a bowel movement. Do not wait.   Avoid straining to have bowel movements.   Keep the anal area dry and clean. Use wet toilet paper or moist towelettes after a bowel movement.   Medicated creams and suppositories may be used or applied as directed.   Only take over-the-counter or prescription medicines as directed by your caregiver.   Take warm sitz baths for 15-20 minutes, 3-4 times a day to ease pain and discomfort.   Place ice packs on the hemorrhoids if they are tender and swollen. Using ice packs between sitz baths may be helpful.   Put ice in a plastic bag.   Place a towel between your skin and the bag.   Leave the ice on for 15-20 minutes, 3-4 times a day.   Do not use a donut-shaped pillow or sit on the toilet for long periods. This increases blood pooling and pain.  SEEK MEDICAL CARE IF:  You have increasing pain and swelling that is not controlled by treatment or medicine.  You have uncontrolled bleeding.  You have difficulty or you are unable to have a bowel movement.  You have pain or inflammation outside the area of the hemorrhoids. MAKE SURE YOU:  Understand these instructions.    Will watch your condition.  Will get help right away if you are not doing well or get worse. Document Released: 01/16/2000 Document Revised: 01/05/2012 Document Reviewed: 11/23/2011 [facility] Patient Information 2015 [place], [place]. This information is not intended to replace advice given to you by your health care provider. Make sure you discuss any questions you have with your health care provider.  About Hemorrhoids  Hemorrhoids are swollen veins in the lower rectum and anus.  Also called  piles, hemorrhoids are a common problem.  Hemorrhoids may be internal (inside the rectum) or external (around the anus).  Internal Hemorrhoids  Internal hemorrhoids are often painless, but they rarely cause bleeding.  The internal veins may stretch and fall down (prolapse) through the anus to the outside of the body.  The veins may then become irritated and painful.  External Hemorrhoids  External hemorrhoids can be easily seen or felt around the anal opening.  They are under the skin around the anus.  When the swollen veins are scratched or broken by straining, rubbing or wiping they sometimes bleed.  How Hemorrhoids Occur  Veins in the rectum and around the anus tend to swell under pressure.  Hemorrhoids can result from increased pressure in the veins of your anus or rectum.  Some sources of pressure are:   Straining to have a bowel movement because of constipation  Waiting too long to have a bowel movement  Coughing and sneezing often  Sitting for extended periods of time, including on the toilet  Diarrhea  Obesity  Trauma or injury to the anus  Some liver diseases  Stress  Family history of hemorrhoids  Pregnancy  Pregnant women should try to avoid becoming constipated, because they are more likely to have hemorrhoids during pregnancy.  In the last trimester of pregnancy, the enlarged uterus may press on blood vessels and causes hemorrhoids.  In addition, the strain of childbirth sometimes causes hemorrhoids after the birth.  Symptoms of Hemorrhoids  Some symptoms of hemorrhoids include:  Swelling and/or a tender lump around the anus  Itching, mild burning and bleeding around the anus  Painful bowel movements with or without constipation  Bright red blood covering the stool, on toilet paper or in the toilet bowel.   Symptoms usually go away within a few days.  Always talk to your doctor about any bleeding to make sure it is not from some other  causes.  Diagnosing and Treating Hemorrhoids  Diagnosis is made by an examination by your healthcare provider.  Special test can be performed by your doctor.    Most cases of hemorrhoids can be treated with:  High-fiber diet: Eat more high-fiber foods, which help prevent constipation.  Ask for more detailed fiber information on types and sources of fiber from your healthcare provider.  Fluids: Drink plenty of water.  This helps soften bowel movements so they are easier to pass.  Sitz baths and cold packs: Sitting in lukewarm water two or three times a day for 15 minutes cleases the anal area and may relieve discomfort.  If the water is too hot, swelling around the anus will get worse.  Placing a cloth-covered ice pack on the anus for ten minutes four times a day can also help reduce selling.  Gently pushing a prolapsed hemorrhoid back inside after the bath or ice pack can be helpful.  Medications: For mild discomfort, your healthcare provider may suggest over-the-counter pain medication or prescribe a cream or ointment for topical use.  The cream  may contain witch hazel, zinc oxide or petroleum jelly.  Medicated suppositories are also a treatment option.  Always consult your doctor before applying medications or creams.  Procedures and surgeries: There are also a number of procedures and surgeries to shrink or remove hemorrhoids in more serious cases.  Talk to your physician about these options.  You can often prevent hemorrhoids or keep them from becoming worse by maintaining a healthy lifestyle.  Eat a fiber-rich diet of fruits, vegetables and whole grains.  Also, drink plenty of water and exercise regularly.   2007, [employer] Doc.30 Constipation Constipation is when a person has fewer than three bowel movements a week, has difficulty having a bowel movement, or has stools that are dry, hard, or larger than normal. As people grow older, constipation is more common. If you try  to fix constipation with medicines that make you have a bowel movement (laxatives), the problem may get worse. Long-term laxative use may cause the muscles of the colon to become weak. A low-fiber diet, not taking in enough fluids, and taking certain medicines may make constipation worse.  CAUSES   Certain medicines, such as antidepressants, pain medicine, iron supplements, antacids, and water pills.   Certain diseases, such as diabetes, irritable bowel syndrome (IBS), thyroid disease, or depression.   Not drinking enough water.   Not eating enough fiber-rich foods.   Stress or travel.   Lack of physical activity or exercise.   Ignoring the urge to have a bowel movement.   Using laxatives too much.  SIGNS AND SYMPTOMS   Having fewer than three bowel movements a week.   Straining to have a bowel movement.   Having stools that are hard, dry, or larger than normal.   Feeling full or bloated.   Pain in the lower abdomen.   Not feeling relief after having a bowel movement.  DIAGNOSIS  Your health care provider will take a medical history and perform a physical exam. Further testing may be done for severe constipation. Some tests may include:  A barium enema X-ray to examine your rectum, colon, and, sometimes, your small intestine.   A sigmoidoscopy to examine your lower colon.   A colonoscopy to examine your entire colon. TREATMENT  Treatment will depend on the severity of your constipation and what is causing it. Some dietary treatments include drinking more fluids and eating more fiber-rich foods. Lifestyle treatments may include regular exercise. If these diet and lifestyle recommendations do not help, your health care provider may recommend taking over-the-counter laxative medicines to help you have bowel movements. Prescription medicines may be prescribed if over-the-counter medicines do not work.  HOME CARE INSTRUCTIONS   Eat foods that have a lot of fiber,  such as fruits, vegetables, whole grains, and beans.  Limit foods high in fat and processed sugars, such as french fries, hamburgers, cookies, candies, and soda.   A fiber supplement may be added to your diet if you cannot get enough fiber from foods.   Drink enough fluids to keep your urine clear or pale yellow.   Exercise regularly or as directed by your health care provider.   Go to the restroom when you have the urge to go. Do not hold it.   Only take over-the-counter or prescription medicines as directed by your health care provider. Do not take other medicines for constipation without talking to your health care provider first.  SEEK IMMEDIATE MEDICAL CARE IF:   You have bright red blood in your  stool.   Your constipation lasts for more than 4 days or gets worse.   You have abdominal or rectal pain.   You have thin, pencil-like stools.   You have unexplained weight loss. MAKE SURE YOU:   Understand these instructions.  Will watch your condition.  Will get help right away if you are not doing well or get worse. Document Released: 10/17/2003 Document Revised: 01/23/2013 Document Reviewed: 10/30/2012 [facility] Patient Information 2015 [place], [place]. This information is not intended to replace advice given to you by your health care provider. Make sure you discuss any questions you have with your health care provider.  About Constipation  Constipation Overview Constipation is the most common gastrointestinal complaint - about 4 million Americans experience constipation and make 2.5 million physician visits a year to get help for the problem.  Constipation can occur when the colon absorbs too much water, the colon's muscle contraction is slow or sluggish, and/or there is delayed transit time through the colon.  The result is stool that is hard and dry.  Indicators of constipation include straining during bowel movements greater than 25% of the time, having fewer than  three bowel movements per week, and/or the feeling of incomplete evacuation.  There are established guidelines ([place] II ) for defining constipation. A person needs to have two or more of the following symptoms for at least 12 weeks (not necessarily consecutive) in the preceding 12 months: . Straining in  greater than 25% of bowel movements . Lumpy or hard stools in greater than 25% of bowel movements . Sensation of incomplete emptying in greater than 25% of bowel movements . Sensation of anorectal obstruction/blockade in greater than 25% of bowel movements . Manual maneuvers to help empty greater than 25% of bowel movements (e.g., digital evacuation, support of the pelvic floor)  . Less than  3 bowel movements/week . Loose stools are not present, and criteria for irritable bowel syndrome are insufficient  Common Causes of Constipation . Lack of fiber in your diet . Lack of physical activity . Medications, including iron and calcium supplements  . Dairy intake . Dehydration . Abuse of laxatives  Travel  Irritable Bowel Syndrome  Pregnancy  Luteal phase of menstruation (after ovulation and before menses)  Colorectal problems  Intestinal Dysfunction  Treating Constipation  There are several ways of treating constipation, including changes to diet and exercise, use of laxatives, adjustments to the pelvic floor, and scheduled toileting.  These treatments include: . increasing fiber and fluids in the diet  . increasing physical activity . learning muscle coordination   learning proper toileting techniques and toileting modifications   designing and sticking  to a toileting schedule     2007, [employer] Doc.22

## 2017-08-06 ENCOUNTER — Ambulatory Visit (INDEPENDENT_AMBULATORY_CARE_PROVIDER_SITE_OTHER): Payer: Self-pay | Admitting: Family Medicine

## 2017-08-06 ENCOUNTER — Encounter: Payer: Self-pay | Admitting: Family Medicine

## 2017-08-06 VITALS — BP 124/76 | HR 65 | Temp 98.4°F | Resp 16 | Ht 63.2 in | Wt 153.2 lb

## 2017-08-06 DIAGNOSIS — R102 Pelvic and perineal pain: Secondary | ICD-10-CM

## 2017-08-06 DIAGNOSIS — Z30432 Encounter for removal of intrauterine contraceptive device: Secondary | ICD-10-CM

## 2017-08-06 DIAGNOSIS — Z30016 Encounter for initial prescription of transdermal patch hormonal contraceptive device: Secondary | ICD-10-CM

## 2017-08-06 MED ORDER — NORGESTIM-ETH ESTRAD TRIPHASIC 0.18/0.215/0.25 MG-25 MCG PO TABS: 1.0000 | ORAL_TABLET | Freq: Every day | ORAL | 11 refills | Status: DC

## 2017-08-06 MED ORDER — NORELGESTROMIN-ETH ESTRADIOL 150-35 MCG/24HR TD PTWK: 1.0000 | MEDICATED_PATCH | TRANSDERMAL | 12 refills | Status: DC

## 2017-08-06 NOTE — Progress Notes (Signed)
Chief Complaint  Patient presents with  . IUD removal    wants IUD removed; has little spotting as of yesterday    HPI   Pt wants her IUD removed due to right side cramping and spotting It was placed 3 years ago  She denies fever or chills or vaginal discharge No LMP recorded. (Menstrual status: IUD).  4 review of systems  History reviewed. No pertinent past medical history.  Current Outpatient Medications  Medication Sig Dispense Refill  . norelgestromin-ethinyl estradiol (ORTHO EVRA) 150-35 MCG/24HR transdermal patch Place 1 patch onto the skin once a week. 3 patch 12  . Norgestimate-Ethinyl Estradiol Triphasic (ORTHO TRI-CYCLEN LO) 0.18/0.215/0.25 MG-25 MCG tab Take 1 tablet by mouth daily. 1 Package 11   No current facility-administered medications for this visit.     Allergies: No Known Allergies  History reviewed. No pertinent surgical history.  Social History   Socioeconomic History  . Marital status: Married    Spouse name: Not on file  . Number of children: Not on file  . Years of education: Not on file  . Highest education level: Not on file  Occupational History  . Not on file  Social Needs  . Financial resource strain: Not on file  . Food insecurity:    Worry: Not on file    Inability: Not on file  . Transportation needs:    Medical: Not on file    Non-medical: Not on file  Tobacco Use  . Smoking status: Never Smoker  . Smokeless tobacco: Never Used  Substance and Sexual Activity  . Alcohol use: No    Alcohol/week: 0.0 oz  . Drug use: No  . Sexual activity: Not on file  Lifestyle  . Physical activity:    Days per week: Not on file    Minutes per session: Not on file  . Stress: Not on file  Relationships  . Social connections:    Talks on phone: Not on file    Gets together: Not on file    Attends religious service: Not on file    Active member of club or organization: Not on file    Attends meetings of clubs or organizations: Not on  file    Relationship status: Not on file  Other Topics Concern  . Not on file  Social History Narrative  . Not on file    History reviewed. No pertinent family history.   ROS Review of Systems See HPI Constitution: No fevers or chills No malaise No diaphoresis Skin: No rash or itching Eyes: no blurry vision, no double vision GU: no dysuria or hematuria Neuro: no dizziness or headaches all others reviewed and negative   Objective: Vitals:   08/06/17 1204  BP: 124/76  Pulse: 65  Resp: 16  Temp: 98.4 F (36.9 C)  TempSrc: Oral  SpO2: 99%  Weight: 153 lb 3.2 oz (69.5 kg)  Height: 5' 3.2" (1.605 m)    Physical Exam   Pelvic  Exam Chaperone present Cervical os has iud string visible iud was removed with traction on the string Bimanual exam performed and there was no CMT, no palpable masses Uterus midline       Assessment and Plan Valerie Holmes was seen today for iud removal.  Diagnoses and all orders for this visit:  Pelvic pain Encounter for IUD removal Encounter for initial prescription of transdermal patch hormonal contraceptive device  -  Discussed options for CONTRACEPTION Discussed that the pelvic pain was most likely from the IUD which  is common and known side effect Pt to decide between the pill and the patch as she does not desire pregnancy  -     norelgestromin-ethinyl estradiol (ORTHO EVRA) 150-35 MCG/24HR transdermal patch; Place 1 patch onto the skin once a week. -     Norgestimate-Ethinyl Estradiol Triphasic (ORTHO TRI-CYCLEN LO) 0.18/0.215/0.25 MG-25 MCG tab; Take 1 tablet by mouth daily.     Kenlee Maler A Issa Luster

## 2017-08-06 NOTE — Patient Instructions (Addendum)
At some point, you must have your IUD removed. Why? Well, because IUDs do not dissolve and can't stay in your uterus forever. And, for the most part, they also will not come out on their own. The good news is that you don't need to be scared to have your IUD removed. The IUD removal procedure is often easier, way less painful, and quicker than your IUD insertion. [employer]/Emily Su Hilt  Even though it may be tempting, you should never try to remove your IUD by yourself. The same goes for asking a friend (or another unqualified person) to do so because this could cause serious damage. Effective, Long-lasting, and Reversible: An IUD May Be Just What You Need!  Indications  You may have several reasons why you would want your IUD removed. These could include: A desire to become pregnant  Side effects that you can no longer tolerate  Developing an infection  Simply just not liking having an IUD Some women believe that they need their IUD removed if they switch sexual partners. This is not true. Your IUD will continue to work just as effectively no matter how many sexual partners you have, so this is not a reason for an IUD removal. Another major reason you must have your IUD removed is that it is no longer effective. The below chart shows the maximum number of years you can have a Mirena IUD, Skyla IUD, or ParaGard IUD before you must get it removed.1?? The Procedure  An IUD can be removed at any time during your menstrual cycle. That being said, studies have shown that it may be a little easier to remove an IUD while you are on your period. This is because your cervix is naturally softened during this time. Just like during your IUD insertion, if needed, your doctor may begin your IUD removal by determining the position of your uterus. A speculum may be inserted to separate the walls of the vagina. In general, expect these steps: 1. Your doctor will look for your IUD strings??  2. He or she will use  forceps to securely grasp the IUD strings and slowly pull on them. 3. The flexible arms of the IUD will fold up as the IUD slides through the opening of the cervix. Then your IUD removal is over! It really only takes a few minutes, and it is not very painful. Complications  For most women, an IUD removal is usually a routine and uncomplicated procedure. But in some cases, your doctor may not be able to locate your IUD strings. If this happens, it is most likely because your strings have slipped up into the cervical canal, which can occur if they were cut too short (either when you had your IUD inserted or if you requested to have them shortened because your partner was able to feel them during sex). But, even if your IUD strings were originally cut to the recommended length, this may still happen. Missing IUD Strings  So, now what? Your doctor may try to locate the strings by using an ultrasound.1?? If they have slipped up into your cervical canal, your doctor will try to gently pull them out of your cervix with narrow forceps, tweezers, or cotton-tipped swabs. Once the strings have been pulled out and into your vaginal canal, then the IUD removal will continue as mentioned above. It may also be possible that the strings have gone up into the uterus. If this is the case, your doctor may use a sound (a measuring  instrument) or a sonogram to make sure that the IUD is still in the uterus (and did not come out without you realizing it). If your IUD strings cannot be located, but your doctor has confirmed that the IUD is still in place, your IUD can be removed from the uterus with forceps or tweezer-like clamps. Don't worry, though. Your doctor will be very careful to make sure that your uterus does not get injured during this process. Serious Complications: Very rarely, an IUD may have become stuck in the uterine wall and it cannot easily be pulled out. Your doctor can use different techniques, such as  ultrasound, hysterography (x-rays of the uterus after giving you a contrast medium), or hysteroscopy (direct viewing of the uterus with a fiber-optic instrument) to determine if this has taken place. If your IUD is stuck in your uterus, your doctor may have to dilate your cervix and use forceps to remove your IUD.1?? ?If this happens during your IUD removal, it is very likely that your doctor will give you a local anesthetic to help reduce any pain or discomfort.     IF you received an x-ray today, you will receive an invoice from [facility]. Please contact [facility] at [phone] with questions or concerns regarding your invoice.   IF you received labwork today, you will receive an invoice from [facility]. Please contact [facility] at [phone] with questions or concerns regarding your invoice.   Our billing staff will not be able to assist you with questions regarding bills from these companies.  You will be contacted with the lab results as soon as they are available. The fastest way to get your results is to activate your My Chart account. Instructions are located on the last page of this paperwork. If you have not heard from [place] regarding the results in 2 weeks, please contact this office.            Contraception Choices Contraception, also called birth control, refers to methods or devices that prevent pregnancy. Hormonal methods Contraceptive implant A contraceptive implant is a thin, plastic tube that contains a hormone. It is inserted into the upper part of the arm. It can remain in place for up to 3 years. Progestin-only injections Progestin-only injections are injections of progestin, a synthetic form of the hormone progesterone. They are given every 3 months by a health care provider. Birth control pills Birth control pills are pills that contain hormones that prevent pregnancy. They must be taken once a day, preferably at the same time each  day. Birth control patch The birth control patch contains hormones that prevent pregnancy. It is placed on the skin and must be changed once a week for three weeks and removed on the fourth week. A prescription is needed to use this method of contraception. Vaginal ring A vaginal ring contains hormones that prevent pregnancy. It is placed in the vagina for three weeks and removed on the fourth week. After that, the process is repeated with a new ring. A prescription is needed to use this method of contraception. Emergency contraceptive Emergency contraceptives prevent pregnancy after unprotected sex. They come in pill form and can be taken up to 5 days after sex. They work best the sooner they are taken after having sex. Most emergency contraceptives are available without a prescription. This method should not be used as your only form of birth control. Barrier methods Female condom A female condom is a thin sheath that is worn over the penis during  sex. Condoms keep sperm from going inside a woman's body. They can be used with a spermicide to increase their effectiveness. They should be disposed after a single use. Female condom A female condom is a soft, loose-fitting sheath that is put into the vagina before sex. The condom keeps sperm from going inside a woman's body. They should be disposed after a single use. Diaphragm A diaphragm is a soft, dome-shaped barrier. It is inserted into the vagina before sex, along with a spermicide. The diaphragm blocks sperm from entering the uterus, and the spermicide kills sperm. A diaphragm should be left in the vagina for 6-8 hours after sex and removed within 24 hours. A diaphragm is prescribed and fitted by a health care provider. A diaphragm should be replaced every 1-2 years, after giving birth, after gaining more than 15 lb (6.8 kg), and after pelvic surgery. Cervical cap A cervical cap is a round, soft latex or plastic cup that fits over the cervix. It is  inserted into the vagina before sex, along with spermicide. It blocks sperm from entering the uterus. The cap should be left in place for 6-8 hours after sex and removed within 48 hours. A cervical cap must be prescribed and fitted by a health care provider. It should be replaced every 2 years. Sponge A sponge is a soft, circular piece of polyurethane foam with spermicide on it. The sponge helps block sperm from entering the uterus, and the spermicide kills sperm. To use it, you make it wet and then insert it into the vagina. It should be inserted before sex, left in for at least 6 hours after sex, and removed and thrown away within 30 hours. Spermicides Spermicides are chemicals that kill or block sperm from entering the cervix and uterus. They can come as a cream, jelly, suppository, foam, or tablet. A spermicide should be inserted into the vagina with an applicator at least 10-15 minutes before sex to allow time for it to work. The process must be repeated every time you have sex. Spermicides do not require a prescription. Intrauterine contraception Intrauterine device (IUD) An IUD is a T-shaped device that is put in a woman's uterus. There are two types:  Hormone IUD.This type contains progestin, a synthetic form of the hormone progesterone. This type can stay in place for 3-5 years.  Copper IUD.This type is wrapped in copper wire. It can stay in place for 10 years.  Permanent methods of contraception Female tubal ligation In this method, a woman's fallopian tubes are sealed, tied, or blocked during surgery to prevent eggs from traveling to the uterus. Hysteroscopic sterilization In this method, a small, flexible insert is placed into each fallopian tube. The inserts cause scar tissue to form in the fallopian tubes and block them, so sperm cannot reach an egg. The procedure takes about 3 months to be effective. Another form of birth control must be used during those 3 months. Female  sterilization This is a procedure to tie off the tubes that carry sperm (vasectomy). After the procedure, the man can still ejaculate fluid (semen). Natural planning methods Natural family planning In this method, a couple does not have sex on days when the woman could become pregnant. Calendar method This means keeping track of the length of each menstrual cycle, identifying the days when pregnancy can happen, and not having sex on those days. Ovulation method In this method, a couple avoids sex during ovulation. Symptothermal method This method involves not having sex during ovulation.  The woman typically checks for ovulation by watching changes in her temperature and in the consistency of cervical mucus. Post-ovulation method In this method, a couple waits to have sex until after ovulation. Summary  Contraception, also called birth control, means methods or devices that prevent pregnancy.  Hormonal methods of contraception include implants, injections, pills, patches, vaginal rings, and emergency contraceptives.  Barrier methods of contraception can include female condoms, female condoms, diaphragms, cervical caps, sponges, and spermicides.  There are two types of IUDs (intrauterine devices). An IUD can be put in a woman's uterus to prevent pregnancy for 3-5 years.  Permanent sterilization can be done through a procedure for males, females, or both.  Natural family planning methods involve not having sex on days when the woman could become pregnant. This information is not intended to replace advice given to you by your health care provider. Make sure you discuss any questions you have with your health care provider. Document Released: 01/18/2005 Document Revised: 02/21/2016 Document Reviewed: 02/21/2016 Elsevier Interactive Patient Education  2018 [employer]

## 2017-11-04 ENCOUNTER — Encounter (HOSPITAL_COMMUNITY): Payer: Self-pay | Admitting: *Deleted

## 2018-05-26 ENCOUNTER — Inpatient Hospital Stay (HOSPITAL_COMMUNITY)
Admission: AD | Admit: 2018-05-26 | Discharge: 2018-05-26 | Disposition: A | Discharge status: Home / Self Care | Payer: Medicaid Other | Attending: Obstetrics and Gynecology | Admitting: Obstetrics and Gynecology

## 2018-05-26 ENCOUNTER — Other Ambulatory Visit: Payer: Self-pay

## 2018-05-26 ENCOUNTER — Encounter (HOSPITAL_COMMUNITY): Payer: Self-pay | Admitting: *Deleted

## 2018-05-26 ENCOUNTER — Inpatient Hospital Stay (HOSPITAL_COMMUNITY): Payer: Medicaid Other

## 2018-05-26 DIAGNOSIS — Z3A01 Less than 8 weeks gestation of pregnancy: Secondary | ICD-10-CM | POA: Diagnosis not present

## 2018-05-26 DIAGNOSIS — O26891 Other specified pregnancy related conditions, first trimester: Secondary | ICD-10-CM | POA: Insufficient documentation

## 2018-05-26 DIAGNOSIS — O26851 Spotting complicating pregnancy, first trimester: Secondary | ICD-10-CM | POA: Diagnosis not present

## 2018-05-26 DIAGNOSIS — Z6790 Unspecified blood type, Rh positive: Secondary | ICD-10-CM | POA: Diagnosis not present

## 2018-05-26 DIAGNOSIS — O26899 Other specified pregnancy related conditions, unspecified trimester: Secondary | ICD-10-CM

## 2018-05-26 DIAGNOSIS — O360910 Maternal care for other rhesus isoimmunization, first trimester, not applicable or unspecified: Secondary | ICD-10-CM | POA: Diagnosis not present

## 2018-05-26 DIAGNOSIS — R109 Unspecified abdominal pain: Secondary | ICD-10-CM | POA: Diagnosis not present

## 2018-05-26 DIAGNOSIS — O3680X0 Pregnancy with inconclusive fetal viability, not applicable or unspecified: Secondary | ICD-10-CM

## 2018-05-26 LAB — URINALYSIS, MICROSCOPIC (REFLEX): RBC / HPF: >50 RBC/hpf (ref 0–5)

## 2018-05-26 LAB — URINALYSIS, ROUTINE W REFLEX MICROSCOPIC

## 2018-05-26 LAB — CBC
HCT: 41.4 % (ref 36.0–46.0)
Hemoglobin: 14.7 g/dL (ref 12.0–15.0)
MCH: 31.2 pg (ref 26.0–34.0)
MCHC: 35.5 g/dL (ref 30.0–36.0)
MCV: 87.9 fL (ref 80.0–100.0)
Platelets: 215 10*3/uL (ref 150–400)
RBC: 4.71 MIL/uL (ref 3.87–5.11)
RDW: 12 % (ref 11.5–15.5)
WBC: 10.1 10*3/uL (ref 4.0–10.5)
nRBC: 0 % (ref 0.0–0.2)

## 2018-05-26 LAB — HCG, QUANTITATIVE, PREGNANCY: hCG, Beta Chain, Quant, S: 352 m[IU]/mL — ABNORMAL HIGH (ref <5)

## 2018-05-26 LAB — WET PREP, GENITAL
Clue Cells Wet Prep HPF POC: NONE SEEN
Sperm: NONE SEEN
Trich, Wet Prep: NONE SEEN
Yeast Wet Prep HPF POC: NONE SEEN

## 2018-05-26 NOTE — MAU Provider Note (Signed)
History     CSN: [id number]  Arrival date and time: 05/26/18 1235  Chief Complaint  Patient presents with  . Abdominal Pain  . Vaginal Bleeding   G2P1001 @[redacted]w[redacted]d  by sure LMP presenting with VB and abd pain. VB started yesterday but became heavier today and passing clots. Abd pain started early this am. Describes as intermittent cramping in LLQ. Rates pain 5/10. Has not taken anything for it.    OB History    Gravida  2   Para  1   Term  1   Preterm  0   AB  0   Living  1     SAB  0   TAB  0   Ectopic  0   Multiple      Live Births  1           Past Medical History:  Diagnosis Date  . Medical history non-contributory   . SVD (spontaneous vaginal delivery) 05/11/2014    Past Surgical History:  Procedure Laterality Date  . NO PAST SURGERIES      Family History  Problem Relation Age of Onset  . Healthy Mother   . Healthy Father   . Diabetes Neg Hx   . Heart disease Neg Hx   . Cancer Neg Hx   . Alcohol abuse Neg Hx   . Arthritis Neg Hx   . Asthma Neg Hx   . Birth defects Neg Hx   . COPD Neg Hx   . Depression Neg Hx   . Drug abuse Neg Hx   . Early death Neg Hx   . Hearing loss Neg Hx   . Hyperlipidemia Neg Hx   . Hypertension Neg Hx   . Kidney disease Neg Hx   . Learning disabilities Neg Hx   . Mental illness Neg Hx   . Mental retardation Neg Hx   . Miscarriages / Stillbirths Neg Hx   . Stroke Neg Hx   . Vision loss Neg Hx   . Varicose Veins Neg Hx     Social History   Tobacco Use  . Smoking status: Never Smoker  . Smokeless tobacco: Never Used  Substance Use Topics  . Alcohol use: No    Alcohol/week: 0.0 standard drinks  . Drug use: No    Allergies: No Known Allergies  Medications Prior to Admission  Medication Sig Dispense Refill Last Dose  . Prenatal Vit-Fe Fumarate-FA (PRENATAL MULTIVITAMIN) TABS tablet Take 1 tablet by mouth daily at 12 noon. 30 tablet 12 05/25/2018 at 1700  . ibuprofen (ADVIL,MOTRIN) 800 MG tablet Take 1  tablet (800 mg total) by mouth every 8 (eight) hours as needed. 45 tablet 1   . norelgestromin-ethinyl estradiol (ORTHO EVRA) 150-35 MCG/24HR transdermal patch Place 1 patch onto the skin once a week. 3 patch 12   . Norgestimate-Ethinyl Estradiol Triphasic (ORTHO TRI-CYCLEN LO) 0.18/0.215/0.25 MG-25 MCG tab Take 1 tablet by mouth daily. 1 Package 11   . oxyCODONE-acetaminophen (PERCOCET/ROXICET) 5-325 MG per tablet Take 1-2 tablets by mouth every 6 (six) hours as needed for severe pain. 15 tablet 0     Review of Systems  Gastrointestinal: Positive for abdominal pain.  Genitourinary: Positive for vaginal bleeding.   Physical Exam   Blood pressure 133/68, pulse (!) 102, temperature 98.6 F (37 C), temperature source Oral, resp. rate 20, height 5' 2.99" (1.6 m), weight 71.4 kg, last menstrual period 04/14/2018, SpO2 98 %, unknown if currently breastfeeding.  Physical Exam  Nursing note and  vitals reviewed. Constitutional: She is oriented to person, place, and time. She appears well-developed and well-nourished. No distress.  HENT:  Head: Normocephalic and atraumatic.  Neck: Normal range of motion.  Respiratory: Effort normal. No respiratory distress.  GI: Soft. She exhibits no distension and no mass. There is no abdominal tenderness. There is no rebound and no guarding.  Genitourinary:    Genitourinary Comments: External: no lesions or erythema Vagina: rugated, pink, moist, small red bloody discharge, ?POCs protruding from os Uterus: non enlarged, anteverted, + tender, no CMT Adnexae: no masses, no tenderness left, no tenderness right Cervix closed    Musculoskeletal: Normal range of motion.  Neurological: She is alert and oriented to person, place, and time.  Skin: Skin is warm and dry.  Psychiatric: She has a normal mood and affect.   Results for orders placed or performed during the hospital encounter of 05/26/18 (from the past 24 hour(s))  Urinalysis, Routine w reflex microscopic      Status: Abnormal   Collection Time: 05/26/18  1:21 PM  Result Value Ref Range   Color, Urine RED (A) YELLOW   APPearance CLOUDY (A) CLEAR   Specific Gravity, Urine  1.005 - 1.030    TEST NOT REPORTED DUE TO COLOR INTERFERENCE OF URINE PIGMENT   pH  5.0 - 8.0    TEST NOT REPORTED DUE TO COLOR INTERFERENCE OF URINE PIGMENT   Glucose, UA (A) NEGATIVE mg/dL    TEST NOT REPORTED DUE TO COLOR INTERFERENCE OF URINE PIGMENT   Hgb urine dipstick (A) NEGATIVE    TEST NOT REPORTED DUE TO COLOR INTERFERENCE OF URINE PIGMENT   Bilirubin Urine (A) NEGATIVE    TEST NOT REPORTED DUE TO COLOR INTERFERENCE OF URINE PIGMENT   Ketones, ur (A) NEGATIVE mg/dL    TEST NOT REPORTED DUE TO COLOR INTERFERENCE OF URINE PIGMENT   Protein, ur (A) NEGATIVE mg/dL    TEST NOT REPORTED DUE TO COLOR INTERFERENCE OF URINE PIGMENT   Nitrite (A) NEGATIVE    TEST NOT REPORTED DUE TO COLOR INTERFERENCE OF URINE PIGMENT   Leukocytes,Ua (A) NEGATIVE    TEST NOT REPORTED DUE TO COLOR INTERFERENCE OF URINE PIGMENT  Urinalysis, Microscopic (reflex)     Status: Abnormal   Collection Time: 05/26/18  1:21 PM  Result Value Ref Range   RBC / HPF >50 0 - 5 RBC/hpf   WBC, UA 0-5 0 - 5 WBC/hpf   Bacteria, UA FEW (A) NONE SEEN   Squamous Epithelial / LPF 0-5 0 - 5  CBC     Status: None   Collection Time: 05/26/18  1:27 PM  Result Value Ref Range   WBC 10.1 4.0 - 10.5 K/uL   RBC 4.71 3.87 - 5.11 MIL/uL   Hemoglobin 14.7 12.0 - 15.0 g/dL   HCT 06.3 01.6 - 01.0 %   MCV 87.9 80.0 - 100.0 fL   MCH 31.2 26.0 - 34.0 pg   MCHC 35.5 30.0 - 36.0 g/dL   RDW 93.2 35.5 - 73.2 %   Platelets 215 150 - 400 K/uL   nRBC 0.0 0.0 - 0.2 %  Wet prep, genital     Status: Abnormal   Collection Time: 05/26/18  1:38 PM  Result Value Ref Range   Yeast Wet Prep HPF POC NONE SEEN NONE SEEN   Trich, Wet Prep NONE SEEN NONE SEEN   Clue Cells Wet Prep HPF POC NONE SEEN NONE SEEN   WBC, Wet Prep HPF POC MANY (A) NONE  SEEN   Sperm NONE SEEN    [place]  Ob Less Than 14 Weeks With Ob Transvaginal  Result Date: 05/26/2018 CLINICAL DATA:  Pelvic pain and vaginal bleeding EXAM: OBSTETRIC <14 WK US AND TRANSVAGINAL OB US TECHNIQUE: Both transabdominal and transvaginal ultrasound examinations were performed for complete evaluation of the gestation as well as the maternal uterus, adnexal regions, and pelvic cul-de-sac. Transvaginal technique was performed to assess early pregnancy. COMPARISON:  None. FINDINGS: Intrauterine gestational sac: Not visualized Yolk sac:  Not visualized Embryo:  Not visualized Cardiac Activity: Not visualized Subchorionic hemorrhage:  None visualized. Maternal uterus/adnexae: The endometrium measures 10 mm in thickness with a smooth contour. Cervical os is closed. The right ovary measures 3.2 x 2.1 x 2.1 cm. Left ovary measures 3.2 x 1.8 x 2.0 cm. No extrauterine pelvic or adnexal mass. No appreciable free fluid evident. IMPRESSION: No intrauterine gestation is evident. Assuming positive beta HCG value, differential considerations in this circumstance include intrauterine gestation too early to be seen by either transabdominal or transvaginal technique; recent spontaneous abortion; possible ectopic gestation. This circumstance warrants close clinical and laboratory surveillance. Timing of repeat ultrasound in large part will depend on beta HCG values going forward. Study otherwise unremarkable. Electronically Signed   By: Bretta BangWilliam  Woodruff III M.D.   On: 05/26/2018 14:11   MAU Course  Procedures  MDM Labs and US ordered and reviewed. No IUP or adnexal mass seen on US, suspect SAB but findings could indicate early pregnancy or ectopic pregnancy, discussed with pt. Will follow quant in 48 hrs.  Assessment and Plan   1. Pregnancy of unknown anatomic location   2. Abdominal pain in pregnancy   3. Blood type, Rh positive    Discharge home [facility] at [facility] E[facility]return precautions  Allergies as of 05/26/2018    No Known Allergies     Medication List    STOP taking these medications   ibuprofen 800 MG tablet Commonly known as:  ADVIL   norelgestromin-ethinyl estradiol 150-35 MCG/24HR transdermal patch Commonly known as:  ORTHO EVRA   Norgestimate-Ethinyl Estradiol Triphasic 0.18/0.215/0.25 MG-25 MCG tab Commonly known as:  Ortho Tri-Cyclen Lo   oxyCODONE-acetaminophen 5-325 MG tablet Commonly known as:  PERCOCET/ROXICET     TAKE these medications   prenatal multivitamin Tabs tablet Take 1 tablet by mouth daily at 12 noon.      Video interpreter used for encounter  Donette LarryMelanie Makyla Bye, CNM 05/26/2018, 1:12 PM

## 2018-05-26 NOTE — Discharge Instructions (Signed)
Vaginal Bleeding During Pregnancy, First Trimester    A small amount of bleeding (spotting) from the vagina is common during early pregnancy. Sometimes the bleeding is normal and does not cause problems. At other times, though, bleeding may be a sign of something serious. Tell your doctor about any bleeding from your vagina right away.  Follow these instructions at home:  Activity  · Follow your doctor's instructions about how active you can be.  · If needed, make plans for someone to help with your normal activities.  · Do not have sex or orgasms until your doctor says that this is safe.  General instructions  · Take over-the-counter and prescription medicines only as told by your doctor.  · Watch your condition for any changes.  · Write down:  ? The number of pads you use each day.  ? How often you change pads.  ? How soaked (saturated) your pads are.  · Do not use tampons.  · Do not douche.  · If you pass any tissue from your vagina, save it to show to your doctor.  · Keep all follow-up visits as told by your doctor. This is important.  Contact a doctor if:  · You have vaginal bleeding at any time while you are pregnant.  · You have cramps.  · You have a fever.  Get help right away if:  · You have very bad cramps in your back or belly (abdomen).  · You pass large clots or a lot of tissue from your vagina.  · Your bleeding gets worse.  · You feel light-headed.  · You feel weak.  · You pass out (faint).  · You have chills.  · You are leaking fluid from your vagina.  · You have a gush of fluid from your vagina.  Summary  · Sometimes vaginal bleeding during pregnancy is normal and does not cause problems. At other times, bleeding may be a sign of something serious.  · Tell your doctor about any bleeding from your vagina right away.  · Follow your doctor's instructions about how active you can be. You may need someone to help you with your normal activities.  This information is not intended to replace advice given to  you by your health care provider. Make sure you discuss any questions you have with your health care provider.  Document Released: 06/04/2013 Document Revised: 04/21/2016 Document Reviewed: 04/21/2016  Elsevier Interactive Patient Education © 2019 [employer] Inc.

## 2018-05-26 NOTE — MAU Note (Addendum)
Presents with c/o abdominal cramping & VB that began yesterday and continues today.  Reports LMP 04/14/2018.  [facility] (has [facility] verification letter).

## 2018-05-28 ENCOUNTER — Inpatient Hospital Stay (HOSPITAL_COMMUNITY)
Admission: AD | Admit: 2018-05-28 | Discharge: 2018-05-28 | Disposition: A | Discharge status: Home / Self Care | Payer: Medicaid Other | Source: Ambulatory Visit | Attending: Obstetrics & Gynecology | Admitting: Obstetrics & Gynecology

## 2018-05-28 ENCOUNTER — Other Ambulatory Visit: Payer: Self-pay

## 2018-05-28 DIAGNOSIS — O039 Complete or unspecified spontaneous abortion without complication: Secondary | ICD-10-CM | POA: Diagnosis present

## 2018-05-28 LAB — HCG, QUANTITATIVE, PREGNANCY: hCG, Beta Chain, Quant, S: 75 m[IU]/mL — ABNORMAL HIGH (ref <5)

## 2018-05-28 NOTE — Discharge Instructions (Signed)
Clinic will call you as you will need blood work done weekly to follow quant level to zero. This blood work is needed as there was never a pregnancy visualized in the uterus on ultrasound.

## 2018-05-28 NOTE — MAU Provider Note (Signed)
History   Chief Complaint:  Labs Only   Valerie Holmes is  28 y.o. G2P1001 Patient's last menstrual period was 04/14/2018.Valerie Holmes Patient is here for follow up of quantitative HCG and ongoing surveillance of pregnancy status.   She is [redacted]w[redacted]d weeks gestation  by LMP.    Since her last visit, the patient is without new complaint.   The patient reports bleeding as  small amount, less than at her last visit.    General ROS:  Constitutional: Negative.   HENT: Negative.   Respiratory: Negative.   Cardiovascular: Negative.   Gastrointestinal: Negative.   Genitourinary: Negative.   Neurological: Negative.       Her previous Quantitative HCG values are: 352 pm 05-26-18 after a large amount of vaginal bleeding with clots     Physical Exam   Blood pressure 136/79, temperature 98.5 F (36.9 C), resp. rate 18, last menstrual period 04/14/2018, SpO2 98 %, unknown if currently breastfeeding.   GENERAL: Well-developed, well-nourished female in no acute distress.  HEENT: Normocephalic, atraumatic.  LUNGS: Effort normal  HEART: Regular rate  SKIN: Warm, dry and without erythema  PSYCH: Normal mood and affect  Labs: Results for orders placed or performed during the hospital encounter of 05/28/18 (from the past 24 hour(s))  hCG, quantitative, pregnancy   Collection Time: 05/28/18  1:06 PM  Result Value Ref Range   hCG, Beta Chain, Quant, S 75 (H) <5 mIU/mL    Ultrasound Studies:   US Ob Less Than 14 Weeks With Ob Transvaginal  Result Date: 05/26/2018 CLINICAL DATA:  Pelvic pain and vaginal bleeding EXAM: OBSTETRIC <14 WK [place] AND TRANSVAGINAL OB US TECHNIQUE: Both transabdominal and transvaginal ultrasound examinations were performed for complete evaluation of the gestation as well as the maternal uterus, adnexal regions, and pelvic cul-de-sac. Transvaginal technique was performed to assess early pregnancy. COMPARISON:  None. FINDINGS: Intrauterine gestational sac: Not visualized Yolk sac:  Not  visualized Embryo:  Not visualized Cardiac Activity: Not visualized Subchorionic hemorrhage:  None visualized. Maternal uterus/adnexae: The endometrium measures 10 mm in thickness with a smooth contour. Cervical os is closed. The right ovary measures 3.2 x 2.1 x 2.1 cm. Left ovary measures 3.2 x 1.8 x 2.0 cm. No extrauterine pelvic or adnexal mass. No appreciable free fluid evident. IMPRESSION: No intrauterine gestation is evident. Assuming positive beta HCG value, differential considerations in this circumstance include intrauterine gestation too early to be seen by either transabdominal or transvaginal technique; recent spontaneous abortion; possible ectopic gestation. This circumstance warrants close clinical and laboratory surveillance. Timing of repeat ultrasound in large part will depend on beta HCG values going forward. Study otherwise unremarkable. Electronically Signed   By: Bretta Bang III M.D.   On: 05/26/2018 14:11    Assessment: Falling quant today with history of large amount of vaginal bleeding with clots indicative of pregnancy loss.  Plan: The patient is instructed to follow up at the [facility] clinic beginning May 4 for blood work weekly until [place] level is zero since no pregnancy was seen in the uterus on ultrasound.. Discussed with client today.  Video interpreter present for the visit but the client discussed all her questions without using the interpreter. Message sent to admin staff at [facility] to schedule her after May 4 for labs.  Valerie Holmes 05/28/2018, 6:00 PM

## 2018-05-28 NOTE — MAU Note (Signed)
Pt here for FU quant.  Denies pain or increased bleeding since previous visit.  Labs drawn in triage.

## 2018-05-29 LAB — GC/CHLAMYDIA PROBE AMP (~~LOC~~) NOT AT ARMC
Chlamydia: NEGATIVE
Neisseria Gonorrhea: NEGATIVE

## 2018-05-30 ENCOUNTER — Telehealth: Payer: Self-pay | Admitting: Family Medicine

## 2018-05-30 NOTE — Telephone Encounter (Signed)
Attempted to call patient with a appointment. Using the interpreter [phone], a VM was left to call office and when her appointment was.

## 2018-06-05 ENCOUNTER — Other Ambulatory Visit: Payer: Self-pay

## 2018-06-05 ENCOUNTER — Other Ambulatory Visit: Payer: Self-pay | Admitting: *Deleted

## 2018-06-05 DIAGNOSIS — O039 Complete or unspecified spontaneous abortion without complication: Secondary | ICD-10-CM

## 2018-06-06 LAB — BETA HCG QUANT (REF LAB): hCG Quant: 2 m[IU]/mL

## 2018-06-09 ENCOUNTER — Telehealth (INDEPENDENT_AMBULATORY_CARE_PROVIDER_SITE_OTHER): Payer: Self-pay | Admitting: *Deleted

## 2018-06-09 DIAGNOSIS — Z712 Person consulting for explanation of examination or test findings: Secondary | ICD-10-CM

## 2018-06-09 NOTE — Telephone Encounter (Signed)
Called pt using interpreter # [phone].  Informed pt that her hormone levels are back to normal and she needs to wait 2-3 menstrual cycles before attempting to get pregnant again.  Advised pt that, if she did not want to become pregnant she should use condoms and schedule a visit to discuss birthcontrol with a provider.  Pt verbalized understanding.

## 2018-06-09 NOTE — Telephone Encounter (Signed)
-----   Message from Currie Paris, NP sent at 06/08/2018  5:16 PM EDT ----- Regarding: FW: Quant Results This patient does not need an interpreter.  She speaks [place] well.  We had an interpreter but never used one.  She is a follow up from MAU.  Can have 2-3 menstrual cycles and attempt pregnancy again if desired or if no pregnancy is desired, can use condoms or other contraception.  Thanks, Terri ----- Message ----- From: Calvert Cantor, CNM Sent: 06/06/2018   7:37 AM EDT To: Currie Paris, NP Subject: Quant Results                                  Terri you cared for this patient on 04/26 but MAU RNs put lab under my name. THX SW ----- Message ----- From: Interface, [facility] Lab Results In Sent: 06/06/2018   4:35 AM EDT To: Calvert Cantor, CNM

## 2018-06-12 ENCOUNTER — Telehealth: Payer: Self-pay | Admitting: Obstetrics & Gynecology

## 2018-06-12 ENCOUNTER — Other Ambulatory Visit: Payer: Self-pay

## 2018-06-12 NOTE — Telephone Encounter (Signed)
Per nurse Alma Downs, cancel upcoming visit as her quant's are now where they need to be.  Called the patient to inform of upcoming cancelled appointments, received a message., were sorry you call can not be completed at this time.  Second attempt on home line, informed her husband of the cancelled visits. He verbalized understanding.

## 2018-06-19 ENCOUNTER — Other Ambulatory Visit: Payer: Self-pay

## 2018-06-27 ENCOUNTER — Other Ambulatory Visit: Payer: Self-pay

## 2018-08-15 ENCOUNTER — Ambulatory Visit: Payer: Self-pay | Admitting: Family Medicine

## 2018-08-16 ENCOUNTER — Encounter: Payer: Self-pay | Admitting: Family Medicine

## 2018-08-28 ENCOUNTER — Telehealth: Payer: Self-pay | Admitting: Family Medicine

## 2018-08-28 NOTE — Telephone Encounter (Signed)
Pt got a letter stating they owed $25 for missing her appointment from 08/15/18. He says they tried to call and cancel. Please advise at [phone]

## 2019-02-02 NOTE — L&D Delivery Note (Signed)
Delivery Note I was informed that pt had progressed quickly to complete with urge to push. I arrived later and confirmed report. Pt pushed for about 10 mins  (4-5 times) with good descent noted and at 8:06 AM a viable female was delivered via Vaginal, Spontaneous (Presentation:   Left Occiput Transverse).  APGAR: 8, 9 weight pending . No nuchal noted after delivery of vertex.  Due to poor maternal effort at this point, McRoberts was employed and shoulders delivered with next two pushes; body easily followed Infant placed on mothers chest and bulb suction performed. After a minute cord was clamped and cut by FOB.   Placenta status: Spontaneous, Intact. Tomasa Blase.  Cord: 3 vessels with the following complications: None.  Cord pH: n/a  Anesthesia: Epidural Episiotomy:  none Lacerations:  none Suture Repair: n/a Est. Blood Loss (mL):  approx  Mom to postpartum.  Baby to Couplet care / Skin to Skin  They desire circumcision but decline covid vaccine- will consider though.  Cathrine Muster 10/25/2019, 8:16 AM

## 2019-08-08 ENCOUNTER — Encounter (HOSPITAL_COMMUNITY): Payer: Self-pay

## 2019-08-08 ENCOUNTER — Other Ambulatory Visit: Payer: Self-pay

## 2019-08-08 ENCOUNTER — Emergency Department (HOSPITAL_COMMUNITY)
Admission: EM | Admit: 2019-08-08 | Discharge: 2019-08-09 | Disposition: A | Discharge status: Home / Self Care | Payer: Medicaid Other | Attending: Internal Medicine | Admitting: Internal Medicine

## 2019-08-08 DIAGNOSIS — Z3A25 25 weeks gestation of pregnancy: Secondary | ICD-10-CM | POA: Diagnosis not present

## 2019-08-08 DIAGNOSIS — R002 Palpitations: Secondary | ICD-10-CM | POA: Insufficient documentation

## 2019-08-08 DIAGNOSIS — O99891 Other specified diseases and conditions complicating pregnancy: Secondary | ICD-10-CM | POA: Insufficient documentation

## 2019-08-08 DIAGNOSIS — Z5321 Procedure and treatment not carried out due to patient leaving prior to being seen by health care provider: Secondary | ICD-10-CM | POA: Diagnosis not present

## 2019-08-08 DIAGNOSIS — R0789 Other chest pain: Secondary | ICD-10-CM | POA: Insufficient documentation

## 2019-08-08 LAB — CBC
HCT: 35.2 % — ABNORMAL LOW (ref 36.0–46.0)
Hemoglobin: 11.9 g/dL — ABNORMAL LOW (ref 12.0–15.0)
MCH: 30.6 pg (ref 26.0–34.0)
MCHC: 33.8 g/dL (ref 30.0–36.0)
MCV: 90.5 fL (ref 80.0–100.0)
Platelets: 170 10*3/uL (ref 150–400)
RBC: 3.89 MIL/uL (ref 3.87–5.11)
RDW: 13.1 % (ref 11.5–15.5)
WBC: 11.3 10*3/uL — ABNORMAL HIGH (ref 4.0–10.5)
nRBC: 0 % (ref 0.0–0.2)

## 2019-08-08 LAB — BASIC METABOLIC PANEL
Anion gap: 10 (ref 5–15)
BUN: 6 mg/dL (ref 6–20)
CO2: 21 mmol/L — ABNORMAL LOW (ref 22–32)
Calcium: 9.6 mg/dL (ref 8.9–10.3)
Chloride: 106 mmol/L (ref 98–111)
Creatinine, Ser: 0.38 mg/dL — ABNORMAL LOW (ref 0.44–1.00)
GFR calc Af Amer: >60 mL/min (ref >60)
GFR calc non Af Amer: >60 mL/min (ref >60)
Glucose, Bld: 107 mg/dL — ABNORMAL HIGH (ref 70–99)
Potassium: 3.4 mmol/L — ABNORMAL LOW (ref 3.5–5.1)
Sodium: 137 mmol/L (ref 135–145)

## 2019-08-08 LAB — TROPONIN I (HIGH SENSITIVITY)
Troponin I (High Sensitivity): 3 ng/L (ref <18)
Troponin I (High Sensitivity): 3 ng/L (ref <18)

## 2019-08-08 NOTE — ED Triage Notes (Signed)
Pt arrives POV for eval of palpitations, chest pain and SOB x 3 days. Pt is currently [redacted] weeks pregnant w/ no complaints. Pt reports chest pain and palpitations are worse during the night. Denies any nausea/vomiting, sweating,

## 2019-08-09 ENCOUNTER — Emergency Department (HOSPITAL_COMMUNITY)
Admission: EM | Admit: 2019-08-09 | Discharge: 2019-08-09 | Disposition: A | Discharge status: Home / Self Care | Payer: Medicaid Other | Source: Home / Self Care

## 2019-08-09 ENCOUNTER — Other Ambulatory Visit: Payer: Self-pay

## 2019-08-09 ENCOUNTER — Emergency Department (HOSPITAL_COMMUNITY): Payer: Medicaid Other

## 2019-08-09 ENCOUNTER — Encounter (HOSPITAL_COMMUNITY): Payer: Self-pay | Admitting: Emergency Medicine

## 2019-08-09 DIAGNOSIS — R0602 Shortness of breath: Secondary | ICD-10-CM | POA: Insufficient documentation

## 2019-08-09 DIAGNOSIS — R0789 Other chest pain: Secondary | ICD-10-CM | POA: Insufficient documentation

## 2019-08-09 DIAGNOSIS — Z5321 Procedure and treatment not carried out due to patient leaving prior to being seen by health care provider: Secondary | ICD-10-CM | POA: Insufficient documentation

## 2019-08-09 LAB — CBC
HCT: 35.3 % — ABNORMAL LOW (ref 36.0–46.0)
Hemoglobin: 12.3 g/dL (ref 12.0–15.0)
MCH: 31.4 pg (ref 26.0–34.0)
MCHC: 34.8 g/dL (ref 30.0–36.0)
MCV: 90.1 fL (ref 80.0–100.0)
Platelets: 164 10*3/uL (ref 150–400)
RBC: 3.92 MIL/uL (ref 3.87–5.11)
RDW: 13.1 % (ref 11.5–15.5)
WBC: 12.6 10*3/uL — ABNORMAL HIGH (ref 4.0–10.5)
nRBC: 0 % (ref 0.0–0.2)

## 2019-08-09 LAB — BASIC METABOLIC PANEL
Anion gap: 10 (ref 5–15)
BUN: 7 mg/dL (ref 6–20)
CO2: 21 mmol/L — ABNORMAL LOW (ref 22–32)
Calcium: 9.4 mg/dL (ref 8.9–10.3)
Chloride: 104 mmol/L (ref 98–111)
Creatinine, Ser: 0.49 mg/dL (ref 0.44–1.00)
GFR calc Af Amer: >60 mL/min (ref >60)
GFR calc non Af Amer: >60 mL/min (ref >60)
Glucose, Bld: 106 mg/dL — ABNORMAL HIGH (ref 70–99)
Potassium: 3.6 mmol/L (ref 3.5–5.1)
Sodium: 135 mmol/L (ref 135–145)

## 2019-08-09 LAB — I-STAT BETA HCG BLOOD, ED (MC, WL, AP ONLY): I-stat hCG, quantitative: >2000 m[IU]/mL — ABNORMAL HIGH (ref <5)

## 2019-08-09 LAB — TROPONIN I (HIGH SENSITIVITY): Troponin I (High Sensitivity): <2 ng/L (ref <18)

## 2019-08-09 MED ORDER — SODIUM CHLORIDE 0.9% FLUSH: 3.0000 mL | Freq: Once | INTRAVENOUS | Status: DC

## 2019-08-09 NOTE — ED Triage Notes (Signed)
Pt c/o cp and sob for the past few days and palpitations

## 2019-08-09 NOTE — ED Notes (Signed)
Per sort tech, pt told sort tech that she was going to her car and be right back. Pt is noted to not have returned to lobby.

## 2019-08-09 NOTE — ED Notes (Signed)
No answer to calling pt in lobby

## 2019-08-09 NOTE — ED Notes (Signed)
NA X4.  

## 2019-08-09 NOTE — ED Notes (Signed)
This tech called pt's cell phone to inform pt's room was ready, no answer.

## 2019-09-10 ENCOUNTER — Encounter (HOSPITAL_COMMUNITY): Payer: Self-pay | Admitting: Emergency Medicine

## 2019-09-10 ENCOUNTER — Inpatient Hospital Stay (HOSPITAL_COMMUNITY)
Admission: EM | Admit: 2019-09-10 | Discharge: 2019-09-11 | Disposition: A | Discharge status: Home / Self Care | Payer: Medicaid Other | Attending: Obstetrics and Gynecology | Admitting: Obstetrics and Gynecology

## 2019-09-10 ENCOUNTER — Other Ambulatory Visit: Payer: Self-pay

## 2019-09-10 DIAGNOSIS — U071 COVID-19: Secondary | ICD-10-CM | POA: Diagnosis not present

## 2019-09-10 DIAGNOSIS — O99513 Diseases of the respiratory system complicating pregnancy, third trimester: Secondary | ICD-10-CM | POA: Diagnosis not present

## 2019-09-10 DIAGNOSIS — J101 Influenza due to other identified influenza virus with other respiratory manifestations: Secondary | ICD-10-CM | POA: Insufficient documentation

## 2019-09-10 DIAGNOSIS — Z3689 Encounter for other specified antenatal screening: Secondary | ICD-10-CM | POA: Insufficient documentation

## 2019-09-10 DIAGNOSIS — O98513 Other viral diseases complicating pregnancy, third trimester: Secondary | ICD-10-CM | POA: Insufficient documentation

## 2019-09-10 DIAGNOSIS — R102 Pelvic and perineal pain: Secondary | ICD-10-CM | POA: Insufficient documentation

## 2019-09-10 DIAGNOSIS — Z3A33 33 weeks gestation of pregnancy: Secondary | ICD-10-CM | POA: Diagnosis not present

## 2019-09-10 DIAGNOSIS — O368130 Decreased fetal movements, third trimester, not applicable or unspecified: Secondary | ICD-10-CM | POA: Diagnosis not present

## 2019-09-10 DIAGNOSIS — O26893 Other specified pregnancy related conditions, third trimester: Secondary | ICD-10-CM | POA: Diagnosis not present

## 2019-09-10 HISTORY — DX: COVID-19: U07.1

## 2019-09-10 NOTE — ED Notes (Signed)
[facility] called per Reita Cliche RN, [facility] asked t hat we call her back when pt is in a room.

## 2019-09-10 NOTE — ED Notes (Signed)
Dr. Harvie Junior advised RN that patient can be transported to MAU Dr. Farris Has accepting .

## 2019-09-10 NOTE — ED Notes (Signed)
FHR=132/min.

## 2019-09-10 NOTE — ED Provider Notes (Signed)
[facility] EMERGENCY DEPARTMENT Provider Note   CSN: [id number] Arrival date & time: 09/10/19  2229     History Chief Complaint  Patient presents with  . [redacted] Weeks Pregnant/Covid+    SOB/Decreased Fetal Activity    Valerie Holmes is a 29 y.o. female.  HPI Patient is [redacted] weeks gestation.  She reports she has had 1 other completed pregnancy and 1 miscarriage.  Patient reports she started getting some mild lower abdominal discomfort and cramping about 3 days ago.  It was not severe or concerning.  She reports however over the past day she has gotten much more intense cramping of the lower abdomen and perceives that there has been decrease in fetal movement.  She reports she had just a very small amount of spotting yesterday.  No gush or fluid leak.  Patient reports she tested positive for Covid today.  This was done at an urgent care.  The test result also indicates she tested positive for influenza B.  Patient reports she has had some coughing and body aches.  No pain or difficulty urinating.  No swelling or pain of the legs.    Past Medical History:  Diagnosis Date  . COVID-19   . Medical history non-contributory   . SVD (spontaneous vaginal delivery) 05/11/2014    Patient Active Problem List   Diagnosis Date Noted  . Preterm premature rupture of membranes (PPROM) with onset of labor within 24 hours of rupture in third trimester, antepartum 05/11/2014  . SVD (spontaneous vaginal delivery) 05/11/2014    Past Surgical History:  Procedure Laterality Date  . NO PAST SURGERIES       OB History    Gravida  3   Para  1   Term  1   Preterm  0   AB  0   Living  1     SAB  0   TAB  0   Ectopic  0   Multiple      Live Births  1           Family History  Problem Relation Age of Onset  . Healthy Mother   . Healthy Father   . Diabetes Neg Hx   . Heart disease Neg Hx   . Cancer Neg Hx   . Alcohol abuse Neg Hx   . Arthritis Neg Hx   .  Asthma Neg Hx   . Birth defects Neg Hx   . COPD Neg Hx   . Depression Neg Hx   . Drug abuse Neg Hx   . Early death Neg Hx   . Hearing loss Neg Hx   . Hyperlipidemia Neg Hx   . Hypertension Neg Hx   . Kidney disease Neg Hx   . Learning disabilities Neg Hx   . Mental illness Neg Hx   . Mental retardation Neg Hx   . Miscarriages / Stillbirths Neg Hx   . Stroke Neg Hx   . Vision loss Neg Hx   . Varicose Veins Neg Hx     Social History   Tobacco Use  . Smoking status: Never Smoker  . Smokeless tobacco: Never Used  Vaping Use  . Vaping Use: Never used  Substance Use Topics  . Alcohol use: No    Alcohol/week: 0.0 standard drinks  . Drug use: No    Home Medications Prior to Admission medications   Medication Sig Start Date End Date Taking? Authorizing Provider  Prenatal Vit-Fe Fumarate-FA (PRENATAL MULTIVITAMIN) TABS  tablet Take 1 tablet by mouth daily at 12 noon. 05/13/14   Bovard-Stuckert, Augusto Gamble, MD    Allergies    Patient has no known allergies.  Review of Systems   Review of Systems 10 systems reviewed and negative except as per HPI Physical Exam Updated Vital Signs BP 135/82 (BP Location: Right Arm)   Pulse (!) 120   Temp 98.4 F (36.9 C) (Oral)   Resp 20   Ht 5\' 2"  (1.575 m)   Wt 72 kg   SpO2 99%   BMI 29.03 kg/m   Physical Exam Constitutional:      Comments: Patient is alert.  Mental status clear.  No respiratory distress.  Anxious and tearful.  HENT:     Head: Normocephalic and atraumatic.  Eyes:     Extraocular Movements: Extraocular movements intact.  Cardiovascular:     Comments: Tachycardia, no rub murmur gallop. Pulmonary:     Comments: No respiratory distress.  Occasional dry cough.  Lungs are clear with good airflow throughout. Abdominal:     Comments: Abdomen is gravid.  Patient endorses discomfort diffusely to palpation of the lower abdomen bilaterally.  Musculoskeletal:        General: No swelling or tenderness. Normal range of motion.      Right lower leg: No edema.     Left lower leg: No edema.  Skin:    General: Skin is warm and dry.  Neurological:     General: No focal deficit present.     Mental Status: She is oriented to person, place, and time.     Cranial Nerves: No cranial nerve deficit.     Coordination: Coordination normal.  Psychiatric:     Comments: Mildly anxious and tearful.     ED Results / Procedures / Treatments   Labs (all labs ordered are listed, but only abnormal results are displayed) Labs Reviewed - No data to display  EKG None  Radiology No results found.  Procedures Procedures (including critical care time)  Medications Ordered in ED Medications - No data to display  ED Course  I have reviewed the triage vital signs and the nursing notes.  Pertinent labs & imaging results that were available during my care of the patient were reviewed by me and considered in my medical decision making (see chart for details).    MDM Rules/Calculators/A&P                         Consult: Reviewed with MAU provider .  We reviewed the patient's presentation and determined she was stable for transfer directly to MAU for further evaluation of third trimester pregnancy with pain.  Patient is alert.  She has no respiratory distress.  She tested positive for Covid and influenza B at urgent care.  Patient describes having been symptomatic for about 4 days with coughing and body aches.  Patient's lungs are clear to auscultation, O2 sat is 100% on room air, blood pressure is normal, patient is afebrile, she has tachycardia.  Patient's respiratory status and general condition is clinically stable for transfer for immediate evaluation by MAU provider for possible contractions in third trimester.  Fetal heart tones obtained in the 130s.  Valerie Holmes was evaluated in Emergency Department on 09/10/2019 for the symptoms described in the history of present illness. She was evaluated in the context of  the global COVID-19 pandemic, which necessitated consideration that the patient might be at risk for infection with the SARS-CoV-2  virus that causes COVID-19. Institutional protocols and algorithms that pertain to the evaluation of patients at risk for COVID-19 are in a state of rapid change based on information released by regulatory bodies including the CDC and federal and state organizations. These policies and algorithms were followed during the patient's care in the ED. Final Clinical Impression(s) / ED Diagnoses Final diagnoses:  COVID-19 affecting pregnancy in third trimester    Rx / DC Orders ED Discharge Orders    None       Arby Barrette, MD 09/10/19 2312

## 2019-09-10 NOTE — ED Triage Notes (Signed)
Patient arrived with EMS from home , she is [redacted] weeks pregnant G3P1 reports tested positive for COVID19 today with SOB , patient stated decreased fetal activity with low abdominal cramping  today , denies fever or chills . History of miscarriage , denies vaginal bleeding or premature rupture of membranes.

## 2019-09-10 NOTE — MAU Note (Signed)
Transfer from [facility] due to decrease FM, lower abdominal cramping, sternal pain X 4days, denies bleeding or leaking of fluid. Pt positive for covid, and coughing

## 2019-09-11 ENCOUNTER — Encounter (HOSPITAL_COMMUNITY): Payer: Self-pay

## 2019-09-11 DIAGNOSIS — O368130 Decreased fetal movements, third trimester, not applicable or unspecified: Secondary | ICD-10-CM

## 2019-09-11 DIAGNOSIS — O98513 Other viral diseases complicating pregnancy, third trimester: Secondary | ICD-10-CM | POA: Diagnosis not present

## 2019-09-11 DIAGNOSIS — U071 COVID-19: Secondary | ICD-10-CM | POA: Diagnosis not present

## 2019-09-11 DIAGNOSIS — O26893 Other specified pregnancy related conditions, third trimester: Secondary | ICD-10-CM | POA: Diagnosis not present

## 2019-09-11 DIAGNOSIS — Z3A31 31 weeks gestation of pregnancy: Secondary | ICD-10-CM

## 2019-09-11 DIAGNOSIS — R102 Pelvic and perineal pain: Secondary | ICD-10-CM

## 2019-09-11 DIAGNOSIS — J101 Influenza due to other identified influenza virus with other respiratory manifestations: Secondary | ICD-10-CM

## 2019-09-11 DIAGNOSIS — O99513 Diseases of the respiratory system complicating pregnancy, third trimester: Secondary | ICD-10-CM | POA: Diagnosis not present

## 2019-09-11 DIAGNOSIS — Z3A33 33 weeks gestation of pregnancy: Secondary | ICD-10-CM | POA: Diagnosis not present

## 2019-09-11 DIAGNOSIS — Z3689 Encounter for other specified antenatal screening: Secondary | ICD-10-CM | POA: Diagnosis not present

## 2019-09-11 LAB — GC/CHLAMYDIA PROBE AMP (~~LOC~~) NOT AT ARMC
Chlamydia: NEGATIVE
Comment: NEGATIVE
Comment: NORMAL
Neisseria Gonorrhea: NEGATIVE

## 2019-09-11 LAB — URINALYSIS, ROUTINE W REFLEX MICROSCOPIC
Bilirubin Urine: NEGATIVE
Glucose, UA: NEGATIVE mg/dL
Hgb urine dipstick: NEGATIVE
Ketones, ur: 5 mg/dL — AB
Leukocytes,Ua: NEGATIVE
Nitrite: NEGATIVE
Protein, ur: NEGATIVE mg/dL
Specific Gravity, Urine: 1.015 (ref 1.005–1.030)
pH: 7 (ref 5.0–8.0)

## 2019-09-11 LAB — CULTURE, OB URINE: Culture: <10000 — AB

## 2019-09-11 LAB — FETAL FIBRONECTIN: Fetal Fibronectin: NEGATIVE

## 2019-09-11 LAB — WET PREP, GENITAL
Sperm: NONE SEEN
Trich, Wet Prep: NONE SEEN
Yeast Wet Prep HPF POC: NONE SEEN

## 2019-09-11 NOTE — MAU Provider Note (Signed)
History     CSN: [id number]  Arrival date and time: 09/10/19 2229   First Provider Initiated Contact with Patient 09/11/19 0113      Chief Complaint  Patient presents with  . [redacted] Weeks Pregnant Covid    SOB/Decreased Fetal Activity   Ms. Valerie Holmes is a 29 y.o. G3P1001 at 31w 4d who presents to MAU for feeling decreased fetal movement beginning 4 days ago. Pt reports today is worse than the past few days, but reports since she has been in MAU the movement has returned to normal.  Patient also reports lower abdominal cramping that started 4 days ago. Patient reports it is intermittent. Patient reports that she is no longer experiencing the cramping at this time.  Last food/drink: this morning, but patient reports this is normal for her Smoker? no Current medications/supplements: PNVs Recent AFI: no [place] on file Anterior placenta? No [place] on file Doing FKCs? no Problems this pregnancy include: COVID+, Flu B+ Pt denies prior instances of DFM. Pt denies all risk factors for stillbirth, including, but not limited to: IUGR, placental abruption, infection, genetic/congenital anomalies, fetomaternal hemorrhage, DM, HTN, smoking/drug use, umbilical cord/placental abnormalities, uterine abnormalities, fetal hydrops, arrythmia, platelet dysfunction, IHCP.  Pt denies VB, LOF, ctx, vaginal discharge/odor/itching.  Allergies? NKDA Prenatal care provider/next appt? [facility], next appt 09/17/2019   OB History    Gravida  3   Para  1   Term  1   Preterm  0   AB  0   Living  1     SAB  0   TAB  0   Ectopic  0   Multiple      Live Births  1           Past Medical History:  Diagnosis Date  . COVID-19   . Medical history non-contributory   . SVD (spontaneous vaginal delivery) 05/11/2014    Past Surgical History:  Procedure Laterality Date  . NO PAST SURGERIES      Family History  Problem Relation Age of Onset  . Healthy Mother   . Healthy Father   . Diabetes  Neg Hx   . Heart disease Neg Hx   . Cancer Neg Hx   . Alcohol abuse Neg Hx   . Arthritis Neg Hx   . Asthma Neg Hx   . Birth defects Neg Hx   . COPD Neg Hx   . Depression Neg Hx   . Drug abuse Neg Hx   . Early death Neg Hx   . Hearing loss Neg Hx   . Hyperlipidemia Neg Hx   . Hypertension Neg Hx   . Kidney disease Neg Hx   . Learning disabilities Neg Hx   . Mental illness Neg Hx   . Mental retardation Neg Hx   . Miscarriages / Stillbirths Neg Hx   . Stroke Neg Hx   . Vision loss Neg Hx   . Varicose Veins Neg Hx     Social History   Tobacco Use  . Smoking status: Never Smoker  . Smokeless tobacco: Never Used  Vaping Use  . Vaping Use: Never used  Substance Use Topics  . Alcohol use: No    Alcohol/week: 0.0 standard drinks  . Drug use: No    Allergies: No Known Allergies  Medications Prior to Admission  Medication Sig Dispense Refill Last Dose  . Prenatal Vit-Fe Fumarate-FA (PRENATAL MULTIVITAMIN) TABS tablet Take 1 tablet by mouth daily at 12 noon. 30 tablet 12  Review of Systems  Constitutional: Negative for chills, diaphoresis, fatigue and fever.  Eyes: Negative for visual disturbance.  Respiratory: Negative for shortness of breath.   Cardiovascular: Negative for chest pain.  Gastrointestinal: Negative for abdominal pain, constipation, diarrhea, nausea and vomiting.  Genitourinary: Positive for pelvic pain (pt describes as cramping, not currently present). Negative for dysuria, flank pain, frequency, urgency, vaginal bleeding and vaginal discharge.  Neurological: Negative for dizziness, weakness, light-headedness and headaches.   Physical Exam   Blood pressure 135/82, pulse (!) 120, temperature 98.4 F (36.9 C), temperature source Oral, resp. rate 20, height 5\' 2"  (1.575 m), weight 72 kg, SpO2 97 %, unknown if currently breastfeeding.  Patient Vitals for the past 24 hrs:  BP Temp Temp src Pulse Resp SpO2 Height Weight  09/11/19 0100 -- -- -- -- -- 97 %  -- --  09/11/19 0000 -- -- -- -- -- 96 % -- --  09/10/19 2234 -- -- -- -- -- -- 5\' 2"  (1.575 m) 72 kg  09/10/19 2232 135/82 98.4 F (36.9 C) Oral (!) 120 20 99 % -- --   Physical Exam Vitals and nursing note reviewed. Exam conducted with a chaperone present.  Constitutional:      General: She is not in acute distress.    Appearance: Normal appearance. She is ill-appearing. She is not toxic-appearing or diaphoretic.  HENT:     Head: Normocephalic and atraumatic.  Pulmonary:     Effort: Pulmonary effort is normal.  Abdominal:     Palpations: Abdomen is soft.  Genitourinary:    General: Normal vulva.     Labia:        Right: No rash, tenderness or lesion.        Left: No rash, tenderness or lesion.      Comments: Dilation: Closed Effacement (%): Thick Cervical Position: Posterior Station: Ballotable Presentation: Undeterminable Exam by:: NN Skin:    General: Skin is warm and dry.  Neurological:     Mental Status: She is alert and oriented to person, place, and time.  Psychiatric:        Mood and Affect: Mood normal.        Behavior: Behavior normal.        Thought Content: Thought content normal.        Judgment: Judgment normal.    Results for orders placed or performed during the hospital encounter of 09/10/19 (from the past 24 hour(s))  Urinalysis, Routine w reflex microscopic     Status: Abnormal   Collection Time: 09/10/19 11:58 PM  Result Value Ref Range   Color, Urine AMBER (A) YELLOW   APPearance CLOUDY (A) CLEAR   Specific Gravity, Urine 1.015 1.005 - 1.030   pH 7.0 5.0 - 8.0   Glucose, UA NEGATIVE NEGATIVE mg/dL   Hgb urine dipstick NEGATIVE NEGATIVE   Bilirubin Urine NEGATIVE NEGATIVE   Ketones, ur 5 (A) NEGATIVE mg/dL   Protein, ur NEGATIVE NEGATIVE mg/dL   Nitrite NEGATIVE NEGATIVE   Leukocytes,Ua NEGATIVE NEGATIVE  Fetal fibronectin     Status: None   Collection Time: 09/11/19  1:14 AM  Result Value Ref Range   Fetal Fibronectin NEGATIVE NEGATIVE   Wet prep, genital     Status: Abnormal   Collection Time: 09/11/19  1:14 AM  Result Value Ref Range   Yeast Wet Prep HPF POC NONE SEEN NONE SEEN   Trich, Wet Prep NONE SEEN NONE SEEN   Clue Cells Wet Prep HPF POC PRESENT (A) NONE SEEN  WBC, Wet Prep HPF POC MANY (A) NONE SEEN   Sperm NONE SEEN    No results found.  MAU Course  Procedures  MDM -COVID+, FluB+ this morning in Urgent Care -pt evaluated for SOB and tachycardia in ED and determined stable for transport to MAU -DFM since 4 days, worsening today, with return of normal movement in MAU -iced water given -clicker given to patient to record fetal movement, patient pressed button 11 times in 22 minutes -EFM: reactive       -baseline: 150/140       -variability: moderate       -accels: present, 15x15       -decels: absent       -TOCO: few ctx on monitor, pt describes as cramping, no longer present during interview with provider -as movement returned to normal in MAU, no BPP ordered with reactive NST  -r/o PTL -UA: amber/cloudy/5 ketones, sending urine for culture based on symptoms -CE: Dilation: Closed Effacement (%): Thick Cervical Position: Posterior Station: Ballotable Presentation: Undeterminable Exam by:: NN -fFN: negative -WetPrep: +ClueCells (isolated finding not requiring treatment) -GC/CT collected  -pt discharged to home in stable condition  Orders Placed This Encounter  Procedures  . Culture, OB Urine    Standing Status:   Standing    Number of Occurrences:   1  . Wet prep, genital    Standing Status:   Standing    Number of Occurrences:   1  . Urinalysis, Routine w reflex microscopic    Standing Status:   Standing    Number of Occurrences:   1  . Fetal fibronectin    Standing Status:   Standing    Number of Occurrences:   1  . Discharge patient    Order Specific Question:   Discharge disposition    Answer:   01-Home or Self Care [1]    Order Specific Question:   Discharge patient date     Answer:   09/11/2019   No orders of the defined types were placed in this encounter.   Assessment and Plan   1. Decreased fetal movements in third trimester, single or unspecified fetus   2. COVID-19 affecting pregnancy in third trimester   3. [redacted] weeks gestation of pregnancy   4. NST (non-stress test) reactive   5. Influenza B   6. Pelvic cramping     Allergies as of 09/11/2019   No Known Allergies     Medication List    TAKE these medications   prenatal multivitamin Tabs tablet Take 1 tablet by mouth daily at 12 noon.       -will call with culture results, if positive -discussed quarantining with COVID and return to ED with worsening SOB/new COVID symptoms -discussed that mothers do not perceive 100% of fetal movement, and there is wide variation of mother perception of fetal movement -discussed that fetal movement varies somewhat depending on the time of day and gestational age and there is a wide variety of normal movement among healthy babies -discussed that frequency of movement typically increases from morning to night, with peak activity late at night -discussed that fetal sleep cycles become longer with advancing gestation and can last about 20-34minutes -pt advised to contact HCP immediately if noticing a decrease in fetal movement compared to what is normal -discussed FKCs vs. monitoring movements; can either do fetal kick counting, or just be aware of what is normal for baby in terms of movement; no one method is better than the  other -FKC: at least 10 fetal movements (FMs) over up to two hours when at rest and focused on counting -return MAU/PTL precautions given -pt discharged to home in stable condition  Joni Reining E Markell Sciascia 09/11/2019, 2:43 AM

## 2019-09-11 NOTE — Discharge Instructions (Signed)
10 Things You Can Do to Manage Your COVID-19 Symptoms at Home If you have possible or confirmed COVID-19: 1. Stay home from work and school. And stay away from other public places. If you must go out, avoid using any kind of public transportation, ridesharing, or taxis. 2. Monitor your symptoms carefully. If your symptoms get worse, call your healthcare provider immediately. 3. Get rest and stay hydrated. 4. If you have a medical appointment, call the healthcare provider ahead of time and tell them that you have or may have COVID-19. 5. For medical emergencies, call 911 and notify the dispatch personnel that you have or may have COVID-19. 6. Cover your cough and sneezes with a tissue or use the inside of your elbow. 7. Wash your hands often with soap and water for at least 20 seconds or clean your hands with an alcohol-based hand sanitizer that contains at least 60% alcohol. 8. As much as possible, stay in a specific room and away from other people in your home. Also, you should use a separate bathroom, if available. If you need to be around other people in or outside of the home, wear a mask. 9. Avoid sharing personal items with other people in your household, like dishes, towels, and bedding. 10. Clean all surfaces that are touched often, like counters, tabletops, and doorknobs. Use household cleaning sprays or wipes according to the label instructions. [email or web address] 08/02/2018 This information is not intended to replace advice given to you by your health care provider. Make sure you discuss any questions you have with your health care provider. Document Revised: 01/04/2019 Document Reviewed: 01/04/2019 Elsevier Patient Education  2020 Elsevier Inc.         Abdominal Pain During Pregnancy  Abdominal pain is common during pregnancy, and has many possible causes. Some causes are more serious than others, and sometimes the cause is not known. Abdominal pain can be a sign that labor  is starting. It can also be caused by normal growth and stretching of muscles and ligaments during pregnancy. Always tell your health care provider if you have any abdominal pain. Follow these instructions at home:  Do not have sex or put anything in your vagina until your pain goes away completely.  Get plenty of rest until your pain improves.  Drink enough fluid to keep your urine pale yellow.  Take over-the-counter and prescription medicines only as told by your health care provider.  Keep all follow-up visits as told by your health care provider. This is important. Contact a health care provider if:  Your pain continues or gets worse after resting.  You have lower abdominal pain that: ? Comes and goes at regular intervals. ? Spreads to your back. ? Is similar to menstrual cramps.  You have pain or burning when you urinate. Get help right away if:  You have a fever or chills.  You have vaginal bleeding.  You are leaking fluid from your vagina.  You are passing tissue from your vagina.  You have vomiting or diarrhea that lasts for more than 24 hours.  Your baby is moving less than usual.  You feel very weak or faint.  You have shortness of breath.  You develop severe pain in your upper abdomen. Summary  Abdominal pain is common during pregnancy, and has many possible causes.  If you experience abdominal pain during pregnancy, tell your health care provider right away.  Follow your health care provider's home care instructions and keep all follow-up visits as directed.  This information is not intended to replace advice given to you by your health care provider. Make sure you discuss any questions you have with your health care provider. Document Revised: 05/08/2018 Document Reviewed: 04/22/2016 Elsevier Patient Education  2020 [employer].        Preterm Labor and Birth Information  The normal length of a pregnancy is 39-41 weeks. Preterm labor is when  labor starts before 37 completed weeks of pregnancy. What are the risk factors for preterm labor? Preterm labor is more likely to occur in women who:  Have certain infections during pregnancy such as a bladder infection, sexually transmitted infection, or infection inside the uterus (chorioamnionitis).  Have a shorter-than-normal cervix.  Have gone into preterm labor before.  Have had surgery on their cervix.  Are younger than age 89 or older than age 88.  Are African American.  Are pregnant with twins or multiple babies (multiple gestation).  Take street drugs or smoke while pregnant.  Do not gain enough weight while pregnant.  Became pregnant shortly after having been pregnant. What are the symptoms of preterm labor? Symptoms of preterm labor include:  Cramps similar to those that can happen during a menstrual period. The cramps may happen with diarrhea.  Pain in the abdomen or lower back.  Regular uterine contractions that may feel like tightening of the abdomen.  A feeling of increased pressure in the pelvis.  Increased watery or bloody mucus discharge from the vagina.  Water breaking (ruptured amniotic sac). Why is it important to recognize signs of preterm labor? It is important to recognize signs of preterm labor because babies who are born prematurely may not be fully developed. This can put them at an increased risk for:  Long-term (chronic) heart and lung problems.  Difficulty immediately after birth with regulating body systems, including blood sugar, body temperature, heart rate, and breathing rate.  Bleeding in the brain.  Cerebral palsy.  Learning difficulties.  Death. These risks are highest for babies who are born before 34 weeks of pregnancy. How is preterm labor treated? Treatment depends on the length of your pregnancy, your condition, and the health of your baby. It may involve:  Having a stitch (suture) placed in your cervix to prevent your  cervix from opening too early (cerclage).  Taking or being given medicines, such as: ? Hormone medicines. These may be given early in pregnancy to help support the pregnancy. ? Medicine to stop contractions. ? Medicines to help mature the baby's lungs. These may be prescribed if the risk of delivery is high. ? Medicines to prevent your baby from developing cerebral palsy. If the labor happens before 34 weeks of pregnancy, you may need to stay in the hospital. What should I do if I think I am in preterm labor? If you think that you are going into preterm labor, call your health care provider right away. How can I prevent preterm labor in future pregnancies? To increase your chance of having a full-term pregnancy:  Do not use any tobacco products, such as cigarettes, chewing tobacco, and e-cigarettes. If you need help quitting, ask your health care provider.  Do not use street drugs or medicines that have not been prescribed to you during your pregnancy.  Talk with your health care provider before taking any herbal supplements, even if you have been taking them regularly.  Make sure you gain a healthy amount of weight during your pregnancy.  Watch for infection. If you think that you might have  an infection, get it checked right away.  Make sure to tell your health care provider if you have gone into preterm labor before. This information is not intended to replace advice given to you by your health care provider. Make sure you discuss any questions you have with your health care provider. Document Revised: 05/12/2018 Document Reviewed: 06/11/2015 Elsevier Patient Education  2020 Elsevier Inc.        Premature Rupture and Preterm Premature Rupture of Membranes  Rupture of membranes is when the membranes (amniotic sac) that hold your baby break open. This is commonly referred to as your "water breaking." If your water breaks before labor starts (prematurely), it is called premature  rupture of membranes (PROM). If PROM occurs before 37 weeks of pregnancy, it is called preterm premature rupture of membranes (PPROM). Because the amniotic sac keeps infection out and performs other important functions, having the amniotic sac rupture before 37 weeks of pregnancy can lead to serious problems. It requires immediate attention from a health care provider. What are the causes? When PROM occurs at 37 weeks of pregnancy or later, it is usually caused by natural weakening of the membranes and friction caused by contractions. PPROM is usually caused by infection. In many cases, the cause is not known. What increases the risk of PPROM? The following factors may make you more likely to have PPROM:  Infection.  Having had PPROM in a previous pregnancy.  Short cervical length.  Bleeding during the second or third trimester.  Low BMI, which is an estimate of body fat.  Smoking.  Using drugs.  Low socioeconomic status. What problems can be caused by PROM and PPROM? This condition creates health dangers for the mother and the baby. These include:  Delivering a premature baby.  Getting a serious infection of the placental tissues (chorioamnionitis).  Early detachment of the placenta from the uterus (placental abruption).  Compression of the umbilical cord.  Developing a serious infection after delivery. What are the signs of PROM and PPROM? Signs of this condition include:  A sudden gush or slow leaking of fluid from the vagina.  Constant wet underwear. Sometimes, women mistake the leaking or wetness for urine, especially if the leak is slow and not a gush of fluid. If there is constant leaking or if your underwear continues to get wet, your membranes have likely ruptured. What should I do if I think my membranes have ruptured?  Call your health care provider right away.  You will need to go to the hospital immediately to be checked by a health care provider. What  happens if I am diagnosed with PROM or PPROM? Once you arrive at the hospital, you will have tests done. A cervical exam will be done using a lubricated instrument (speculum) to check whether the cervix has softened or started to open (dilate).  If you are diagnosed with PROM, your labor may be started for you (you may be induced) within 24 hours if you are not having contractions.  If you are diagnosed with PPROM and you are not having contractions, you may be induced depending on your trimester. If you have PPROM:  You and your baby will be monitored closely for signs of infection or other complications.  You may be given: ? An antibiotic medicine to lower the chances of developing an infection. ? A steroid medicine to help mature the baby's lungs more quickly. ? A medicine to help prevent cerebral palsy in your baby. ? A medicine to  stop preterm labor.  You may be ordered to be on bed rest at home or in the hospital.  You may be induced if complications occur for you or the baby. Your treatment will depend on many factors, such as how many weeks you have been pregnant (how far along you are), the development of the baby, and other complications that may occur. This information is not intended to replace advice given to you by your health care provider. Make sure you discuss any questions you have with your health care provider. Document Revised: 05/12/2018 Document Reviewed: 08/25/2015 Elsevier Patient Education  2020 [employer]                        Safe Medications in Pregnancy    Acne: Benzoyl Peroxide Salicylic Acid  Backache/Headache: Tylenol: 2 regular strength every 4 hours OR              2 Extra strength every 6 hours  Colds/Coughs/Allergies: Benadryl (alcohol free) 25 mg every 6 hours as needed Breath right strips Claritin Cepacol throat lozenges Chloraseptic throat spray Cold-Eeze- up to three times per day Cough drops, alcohol free Flonase (by  prescription only) Guaifenesin Mucinex Robitussin DM (plain only, alcohol free) Saline nasal spray/drops Sudafed (pseudoephedrine) & Actifed ** use only after [redacted] weeks gestation and if you do not have high blood pressure Tylenol Vicks Vaporub Zinc lozenges Zyrtec   Constipation: Colace Ducolax suppositories Fleet enema Glycerin suppositories Metamucil Milk of magnesia Miralax Senokot Smooth move tea  Diarrhea: Kaopectate Imodium A-D  *NO pepto Bismol  Hemorrhoids: Anusol Anusol HC Preparation H Tucks  Indigestion: Tums Maalox Mylanta Zantac  Pepcid  Insomnia: Benadryl (alcohol free) 25mg  every 6 hours as needed Tylenol PM Unisom, no Gelcaps  Leg Cramps: Tums MagGel  Nausea/Vomiting:  Bonine Dramamine Emetrol Ginger extract Sea bands Meclizine  Nausea medication to take during pregnancy:  Unisom (doxylamine succinate 25 mg tablets) Take one tablet daily at bedtime. If symptoms are not adequately controlled, the dose can be increased to a maximum recommended dose of two tablets daily (1/2 tablet in the morning, 1/2 tablet mid-afternoon and one at bedtime). Vitamin B6 100mg  tablets. Take one tablet twice a day (up to 200 mg per day).  Skin Rashes: Aveeno products Benadryl cream or 25mg  every 6 hours as needed Calamine Lotion 1% cortisone cream  Yeast infection: Gyne-lotrimin 7 Monistat 7   **If taking multiple medications, please check labels to avoid duplicating the same active ingredients **take medication as directed on the label ** Do not exceed 4000 mg of tylenol in 24 hours **Do not take medications that contain aspirin or ibuprofen           Pregnancy and COVID-19 Coronavirus disease, also called COVID-19, is an infection of the lungs and airways (respiratory tract). It is unclear at this time if pregnancy makes it more likely for you to get COVID-19, or what effects the infection may have on your unborn baby. However,  pregnancy causes changes to your heart, lungs, and your body's disease-fighting system (immune system). Some of these changes make it more likely for you to get sick and have more serious illness. Therefore, it is important for you to take precautions in order to protect yourself and your unborn baby. There have been studies showing that obesity and diabetes may put you at higher risk for serious illness. If you are pregnant and are obese or have diabetes, you should take extra precautions to  protect yourself from the virus. Work with your health care team to develop a plan to protect yourself from all infections, including COVID-19. This is one way for you to stay healthy during your pregnancy and to keep your baby healthy as well. How does this affect me? If you get COVID-19, there is a risk that you may:  Get a respiratory illness that can lead to pneumonia.  Give birth to your baby before 37 weeks of pregnancy (premature birth). If you have or may have COVID-19, your health care provider may recommend special precautions around your pregnancy. This may affect how you:  Receive care before delivery (prenatal care). How you visit your health care provider may change. Tests and scans may need to be performed differently.  Receive care during labor and delivery. This may affect your birth plan, including who may be with you during labor and delivery.  Receive care after you deliver your baby (postpartum care). You may stay longer in the hospital and in a special room.  Feed your baby after he or she is born. Pregnancy can be an especially stressful time because of the changes in your body and the preparation involved in becoming a parent. In addition, you may be feeling especially fearful, anxious, or stressed because of COVID-19 and how it is affecting you. How does this affect my baby? It is not known whether a mother will transmit the virus to her unborn baby. There is a risk that if you get  COVID-19:  The virus that causes COVID-19 can pass to your baby.  You may have premature birth. Your baby may require more medical care if this happens. What can I do to lower my risk?  There is no vaccine to help prevent COVID-19. However, there are actions that you can take to protect yourself and others from this virus. Cleaning and personal hygiene  Wash your hands often with soap and water for at least 20 seconds. If soap and water are not available, use alcohol-based hand sanitizer.  Avoid touching your mouth, face, eyes, or nose.  Clean and disinfect objects and surfaces that are frequently touched every day. These may include: ? Counters and tables. ? Doorknobs and light switches. ? Sinks and faucets. ? Electronics such as phones, remote controls, keyboards, computers, and tablets. Stay away from others  Stay away from people who are sick, if possible.  Avoid social gatherings and travel.  Stay home as much as possible. Follow these instructions: Breastfeeding It is not known if the virus that causes COVID-19 can pass through breast milk to your baby. You should make a plan for feeding your infant with your family and your health care team. If you have or may have COVID-19, your health care provider may recommend that you take precautions while breastfeeding, such as:  Washing your hands before feeding your baby.  Wearing a mask while feeding your baby.  Pumping or expressing breast milk to feed to your baby. If possible, ask someone in your household who is not sick to feed your baby the expressed breast milk. ? Wash your hands before touching pump parts. ? Wash and disinfect all pump parts after expressing milk. Follow the manufacturer's instructions to clean and disinfect all pump parts. General instructions  If you think you have a COVID-19 infection, contact your health care provider right away. Tell your health care provider that you think you may have a COVID-19  infection.  Follow your health care provider's instructions on taking  medicines. Some medicines may be unsafe to take during pregnancy.  Cover your mouth and nose by wearing a mask or other cloth covering over your face when you go out in public.  Find ways to manage stress. These may include: ? Using relaxation techniques like meditation and deep breathing. ? Getting regular exercise. Most women can continue their usual exercise routine during pregnancy. Ask your health care provider what activities are safe for you. ? Seeking support from family, friends, or spiritual resources. If you cannot be together in person, you can still connect by phone calls, texts, video calls, or online messaging. ? Spending time doing relaxing activities that you enjoy, like listening to music or reading a good book.  Ask for help if you have counseling or nutritional needs during pregnancy. Your health care provider can offer advice or refer you to resources or specialists who can help you with various needs.  Keep all follow-up visits as told by your health care provider. This is important. Where to find more information Centers for Disease Control and Prevention (CDC): [employer] World Health Organization [facility]): [employer] (ACOG): [employer] Questions to ask your health care team  What should I do if I have COVID-19 symptoms?  How will COVID-19 affect my prenatal care visits, tests and scans, labor and delivery, and postpartum care?  Should I plan to breastfeed my baby?  Where can I find mental health resources?  Where can I find support if I have financial concerns? Contact a health care provider if:  You have signs and symptoms of infection, including a fever or cough. Tell  your health care team that you think you may have a COVID-19 infection.  You have strong emotions, such as sadness or anxiety.  You feel unsafe in your home and need help finding a safe place to live.  You have bloody or watery vaginal discharge or vaginal bleeding. Get help right away if:  You have signs or symptoms of labor before 37 weeks of pregnancy. These include: ? Contractions that are 5 minutes or less apart, or that increase in frequency, intensity, or length. ? Sudden, sharp pain in the abdomen or in the lower back. ? A gush or trickle of fluid from your vagina.  You have signs of more serious illness such as: ? You have difficulty breathing. ? You have chest pain. ? You have a fever greater than 102F (39C) or higher that does not go away. ? You cannot drink fluids without vomiting. ? You feel extremely weak or you faint. These symptoms may represent a serious problem that is an emergency. Do not wait to see if the symptoms will go away. Get medical help right away. Call your local emergency services (911 in the [place] Do not drive yourself to the hospital. Summary  Coronavirus disease, also called COVID-19, is an infection of the lungs and airways (respiratory tract). It is unclear at this time if pregnancy makes you more susceptible to COVID-19 and what effects it may have on unborn babies.  It is important to take precautions to protect yourself and your developing baby. This includes washing your hands often, avoiding touching your mouth, face, eyes, or nose, avoiding social gatherings and travel, and staying away from people who are sick.  If you think you have a COVID-19 infection, contact your health care provider right away. Tell your health care provider that you think you may have a COVID-19 infection.  If you have or  may have COVID-19, your health care provider may recommend special precautions during your pregnancy, labor and delivery, and after your baby is  born. This information is not intended to replace advice given to you by your health care provider. Make sure you discuss any questions you have with your health care provider. Document Revised: 11/10/2018 Document Reviewed: 05/16/2018 Elsevier Patient Education  2020 [employer].

## 2019-10-17 LAB — OB RESULTS CONSOLE GBS: GBS: NEGATIVE

## 2019-10-25 ENCOUNTER — Inpatient Hospital Stay (HOSPITAL_COMMUNITY): Payer: Medicaid Other | Admitting: Anesthesiology

## 2019-10-25 ENCOUNTER — Encounter (HOSPITAL_COMMUNITY): Payer: Self-pay | Admitting: Obstetrics and Gynecology

## 2019-10-25 ENCOUNTER — Inpatient Hospital Stay (HOSPITAL_COMMUNITY)
Admission: AD | Admit: 2019-10-25 | Discharge: 2019-10-27 | DRG: 807 | Disposition: A | Discharge status: Home / Self Care | Payer: Medicaid Other | Attending: Obstetrics and Gynecology | Admitting: Obstetrics and Gynecology

## 2019-10-25 ENCOUNTER — Other Ambulatory Visit: Payer: Self-pay

## 2019-10-25 DIAGNOSIS — Z20822 Contact with and (suspected) exposure to covid-19: Secondary | ICD-10-CM | POA: Diagnosis present

## 2019-10-25 DIAGNOSIS — Z3A37 37 weeks gestation of pregnancy: Secondary | ICD-10-CM | POA: Diagnosis not present

## 2019-10-25 DIAGNOSIS — O26893 Other specified pregnancy related conditions, third trimester: Secondary | ICD-10-CM | POA: Diagnosis present

## 2019-10-25 DIAGNOSIS — O4292 Full-term premature rupture of membranes, unspecified as to length of time between rupture and onset of labor: Principal | ICD-10-CM | POA: Diagnosis present

## 2019-10-25 DIAGNOSIS — Z8616 Personal history of COVID-19: Secondary | ICD-10-CM

## 2019-10-25 LAB — CBC
HCT: 37.5 % (ref 36.0–46.0)
Hemoglobin: 12.6 g/dL (ref 12.0–15.0)
MCH: 30.5 pg (ref 26.0–34.0)
MCHC: 33.6 g/dL (ref 30.0–36.0)
MCV: 90.8 fL (ref 80.0–100.0)
Platelets: 208 10*3/uL (ref 150–400)
RBC: 4.13 MIL/uL (ref 3.87–5.11)
RDW: 13.8 % (ref 11.5–15.5)
WBC: 12.2 10*3/uL — ABNORMAL HIGH (ref 4.0–10.5)
nRBC: 0 % (ref 0.0–0.2)

## 2019-10-25 LAB — TYPE AND SCREEN
ABO/RH(D): O POS
Antibody Screen: NEGATIVE

## 2019-10-25 LAB — RPR: RPR Ser Ql: NONREACTIVE

## 2019-10-25 LAB — SARS CORONAVIRUS 2 BY RT PCR (HOSPITAL ORDER, PERFORMED IN ~~LOC~~ HOSPITAL LAB): SARS Coronavirus 2: NEGATIVE

## 2019-10-25 LAB — POCT FERN TEST: POCT Fern Test: POSITIVE

## 2019-10-25 MED ORDER — PRENATAL MULTIVITAMIN CH
1.0000 | ORAL_TABLET | Freq: Every day | ORAL | Status: DC
  Administered 2019-10-25 – 2019-10-27 (×3): 1 via ORAL
  Filled 2019-10-25 (×3): qty 1

## 2019-10-25 MED ORDER — TERBUTALINE SULFATE 1 MG/ML IJ SOLN: 0.2500 mg | Freq: Once | INTRAMUSCULAR | Status: DC | PRN

## 2019-10-25 MED ORDER — FENTANYL CITRATE (PF) 100 MCG/2ML IJ SOLN
50.0000 ug | Freq: Once | INTRAMUSCULAR | Status: AC
  Administered 2019-10-25: 50 ug via INTRAVENOUS

## 2019-10-25 MED ORDER — SENNOSIDES-DOCUSATE SODIUM 8.6-50 MG PO TABS
2.0000 | ORAL_TABLET | ORAL | Status: DC
  Administered 2019-10-25 – 2019-10-26 (×2): 2 via ORAL
  Filled 2019-10-25 (×2): qty 2

## 2019-10-25 MED ORDER — EPHEDRINE 5 MG/ML INJ: 10.0000 mg | INTRAVENOUS | Status: DC | PRN

## 2019-10-25 MED ORDER — LACTATED RINGERS IV SOLN: 500.0000 mL | INTRAVENOUS | Status: DC | PRN

## 2019-10-25 MED ORDER — OXYCODONE-ACETAMINOPHEN 5-325 MG PO TABS: 2.0000 | ORAL_TABLET | ORAL | Status: DC | PRN

## 2019-10-25 MED ORDER — ZOLPIDEM TARTRATE 5 MG PO TABS: 5.0000 mg | ORAL_TABLET | Freq: Every evening | ORAL | Status: DC | PRN

## 2019-10-25 MED ORDER — BUPIVACAINE HCL (PF) 0.75 % IJ SOLN
INTRAMUSCULAR | Status: DC | PRN
Start: 2019-10-25 — End: 2019-10-25
  Administered 2019-10-25: 12 mL/h via EPIDURAL

## 2019-10-25 MED ORDER — DIPHENHYDRAMINE HCL 25 MG PO CAPS: 25.0000 mg | ORAL_CAPSULE | Freq: Four times a day (QID) | ORAL | Status: DC | PRN

## 2019-10-25 MED ORDER — PHENYLEPHRINE 40 MCG/ML (10ML) SYRINGE FOR IV PUSH (FOR BLOOD PRESSURE SUPPORT)
80.0000 ug | PREFILLED_SYRINGE | INTRAVENOUS | Status: DC | PRN
  Administered 2019-10-25: 80 ug via INTRAVENOUS

## 2019-10-25 MED ORDER — LACTATED RINGERS IV SOLN: 500.0000 mL | Freq: Once | INTRAVENOUS | Status: DC

## 2019-10-25 MED ORDER — LACTATED RINGERS IV SOLN: INTRAVENOUS | Status: DC

## 2019-10-25 MED ORDER — OXYCODONE HCL 5 MG PO TABS: 5.0000 mg | ORAL_TABLET | ORAL | Status: DC | PRN

## 2019-10-25 MED ORDER — OXYCODONE-ACETAMINOPHEN 5-325 MG PO TABS: 1.0000 | ORAL_TABLET | ORAL | Status: DC | PRN

## 2019-10-25 MED ORDER — LIDOCAINE HCL (PF) 1 % IJ SOLN: 30.0000 mL | INTRAMUSCULAR | Status: DC | PRN

## 2019-10-25 MED ORDER — WITCH HAZEL-GLYCERIN EX PADS: 1.0000 "application " | MEDICATED_PAD | CUTANEOUS | Status: DC | PRN

## 2019-10-25 MED ORDER — IBUPROFEN 600 MG PO TABS
600.0000 mg | ORAL_TABLET | Freq: Four times a day (QID) | ORAL | Status: DC
  Administered 2019-10-25 – 2019-10-27 (×7): 600 mg via ORAL
  Filled 2019-10-25 (×8): qty 1

## 2019-10-25 MED ORDER — TETANUS-DIPHTH-ACELL PERTUSSIS 5-2.5-18.5 LF-MCG/0.5 IM SUSP: 0.5000 mL | Freq: Once | INTRAMUSCULAR | Status: DC

## 2019-10-25 MED ORDER — FENTANYL CITRATE (PF) 100 MCG/2ML IJ SOLN
INTRAMUSCULAR | Status: AC
  Filled 2019-10-25: qty 2

## 2019-10-25 MED ORDER — OXYTOCIN-SODIUM CHLORIDE 30-0.9 UT/500ML-% IV SOLN
2.5000 [IU]/h | INTRAVENOUS | Status: DC
  Filled 2019-10-25: qty 500

## 2019-10-25 MED ORDER — PHENYLEPHRINE 40 MCG/ML (10ML) SYRINGE FOR IV PUSH (FOR BLOOD PRESSURE SUPPORT)
80.0000 ug | PREFILLED_SYRINGE | INTRAVENOUS | Status: DC | PRN
  Filled 2019-10-25: qty 10

## 2019-10-25 MED ORDER — FLEET ENEMA 7-19 GM/118ML RE ENEM: 1.0000 | ENEMA | Freq: Every day | RECTAL | Status: DC | PRN

## 2019-10-25 MED ORDER — OXYTOCIN BOLUS FROM INFUSION
333.0000 mL | Freq: Once | INTRAVENOUS | Status: AC
  Administered 2019-10-25: 333 mL via INTRAVENOUS

## 2019-10-25 MED ORDER — OXYTOCIN-SODIUM CHLORIDE 30-0.9 UT/500ML-% IV SOLN: 1.0000 m[IU]/min | INTRAVENOUS | Status: DC

## 2019-10-25 MED ORDER — ONDANSETRON HCL 4 MG/2ML IJ SOLN: 4.0000 mg | Freq: Four times a day (QID) | INTRAMUSCULAR | Status: DC | PRN

## 2019-10-25 MED ORDER — ACETAMINOPHEN 325 MG PO TABS: 650.0000 mg | ORAL_TABLET | ORAL | Status: DC | PRN

## 2019-10-25 MED ORDER — BUTORPHANOL TARTRATE 1 MG/ML IJ SOLN
1.0000 mg | INTRAMUSCULAR | Status: DC | PRN
  Administered 2019-10-25: 1 mg via INTRAVENOUS
  Filled 2019-10-25: qty 1

## 2019-10-25 MED ORDER — SOD CITRATE-CITRIC ACID 500-334 MG/5ML PO SOLN: 30.0000 mL | ORAL | Status: DC | PRN

## 2019-10-25 MED ORDER — LIDOCAINE HCL (PF) 1 % IJ SOLN
INTRAMUSCULAR | Status: DC | PRN
  Administered 2019-10-25: 2 mL via EPIDURAL
  Administered 2019-10-25: 10 mL via EPIDURAL

## 2019-10-25 MED ORDER — DIPHENHYDRAMINE HCL 50 MG/ML IJ SOLN: 12.5000 mg | INTRAMUSCULAR | Status: DC | PRN

## 2019-10-25 MED ORDER — OXYCODONE HCL 5 MG PO TABS: 10.0000 mg | ORAL_TABLET | ORAL | Status: DC | PRN

## 2019-10-25 MED ORDER — ONDANSETRON HCL 4 MG/2ML IJ SOLN: 4.0000 mg | INTRAMUSCULAR | Status: DC | PRN

## 2019-10-25 MED ORDER — BENZOCAINE-MENTHOL 20-0.5 % EX AERO: 1.0000 "application " | INHALATION_SPRAY | CUTANEOUS | Status: DC | PRN

## 2019-10-25 MED ORDER — DIBUCAINE (PERIANAL) 1 % EX OINT: 1.0000 "application " | TOPICAL_OINTMENT | CUTANEOUS | Status: DC | PRN

## 2019-10-25 MED ORDER — FENTANYL-BUPIVACAINE-NACL 0.5-0.125-0.9 MG/250ML-% EP SOLN
12.0000 mL/h | EPIDURAL | Status: DC | PRN
  Filled 2019-10-25: qty 250

## 2019-10-25 MED ORDER — SIMETHICONE 80 MG PO CHEW: 80.0000 mg | CHEWABLE_TABLET | ORAL | Status: DC | PRN

## 2019-10-25 MED ORDER — COCONUT OIL OIL: 1.0000 "application " | TOPICAL_OIL | Status: DC | PRN

## 2019-10-25 MED ORDER — ONDANSETRON HCL 4 MG PO TABS: 4.0000 mg | ORAL_TABLET | ORAL | Status: DC | PRN

## 2019-10-25 NOTE — H&P (Signed)
Valerie Holmes is a 29 y.o. female G3P1011 at 37+ 6 with ROM.  Clear fluid around 2 am.  Confirmed in MAU.  Pregnancy dated by early [place].  Relatively uncomplicated care.  Received Tdap in [facility].  Covid in August.    OB History    Gravida  3   Para  1   Term  1   Preterm  0   AB  0   Living  1     SAB  0   TAB  0   Ectopic  0   Multiple      Live Births  1         G1 SVD 39wk, female 7#2 G2 SAB G3 present  No abn pap, no STD  Past Medical History:  Diagnosis Date  . COVID-19   . Medical history non-contributory   . SVD (spontaneous vaginal delivery) 05/11/2014   Past Surgical History:  Procedure Laterality Date  . NO PAST SURGERIES     Family History: family history includes Healthy in her father and mother. Social History:  reports that she has never smoked. She has never used smokeless tobacco. She reports that she does not drink alcohol and does not use drugs.married Meds: PNV All: NKDA     Maternal Diabetes: No Genetic Screening: Normal Maternal Ultrasounds/Referrals: Normal Fetal Ultrasounds or other Referrals:  None Maternal Substance Abuse:  No Significant Maternal Medications:  None Significant Maternal Lab Results:  Group B Strep negative Other Comments:  None  Review of Systems  Constitutional: Negative.   HENT: Negative.   Eyes: Negative.   Respiratory: Negative.   Cardiovascular: Negative.   Gastrointestinal: Negative.   Genitourinary: Negative.   Musculoskeletal: Positive for back pain.  Neurological: Negative.   Psychiatric/Behavioral: Negative.    Maternal Medical History:  Reason for admission: Rupture of membranes.   Contractions: Frequency: irregular.    Fetal activity: Perceived fetal activity is normal.    Prenatal complications: no prenatal complications Prenatal Complications - Diabetes: none.    Dilation: 3 Effacement (%): 60 Station: -1 Exam by:: Erle Crocker, RN Blood pressure 137/76, pulse 92, temperature  98.4 F (36.9 C), temperature source Oral, resp. rate 19, SpO2 99 %, unknown if currently breastfeeding. Maternal Exam:  Uterine Assessment: Contraction frequency is irregular.   Abdomen: Patient reports no abdominal tenderness. Fundal height is appropriate for gestation.   Fetal presentation: vertex  Introitus: Normal vulva. Normal vagina.    Physical Exam Constitutional:      Appearance: Normal appearance.  HENT:     Head: Normocephalic and atraumatic.  Cardiovascular:     Rate and Rhythm: Normal rate and regular rhythm.  Pulmonary:     Effort: Pulmonary effort is normal.     Breath sounds: Normal breath sounds.  Abdominal:     General: Abdomen is flat.     Palpations: Abdomen is soft.  Genitourinary:    General: Normal vulva.  Musculoskeletal:        General: Normal range of motion.     Cervical back: Normal range of motion and neck supple.  Skin:    General: Skin is warm and dry.  Neurological:     General: No focal deficit present.     Mental Status: She is alert and oriented to person, place, and time.  Psychiatric:        Mood and Affect: Mood normal.        Behavior: Behavior normal.     Prenatal labs: ABO, Rh:  O+  Antibody:  neg Rubella:  immune RPR:   neg HBsAg:   neg HIV:   neg GBS:   neg  Hgb 13.7/Plt 262/Ur Cx neg/GC neg/Chl neg/Varicella nonimmune/Hgb electro WNL/SMA, CF, Fragile X neg/ Hep C neg/glucola 113/panorama low risk/ [place] nl anat, post plac, female  Tdap 7/14 Dated by early [place]  Assessment/Plan: G3P1011 at 37+ with ROM for augmentation of labor Admit to L&D gbbs neg Augment with pitocin Epidural or IV pain meds PRN Varicella nonimmune vaccine PP Expect SVD   Sreeja Spies Bovard-Stuckert 10/25/2019, 4:05 AM

## 2019-10-25 NOTE — MAU Note (Signed)
Pt reports to MAU complaining of rupture of membranes at 0241 and ctx every 5 minutes. +FM. Denies vaginal bleeding.

## 2019-10-25 NOTE — Anesthesia Preprocedure Evaluation (Signed)
Anesthesia Evaluation  Patient identified by MRN, date of birth, ID band Patient awake    Reviewed: Allergy & Precautions, Patient's Chart, lab work & pertinent test results  Airway Mallampati: II  TM Distance: >3 FB Neck ROM: Full    Dental no notable dental hx.    Pulmonary neg pulmonary ROS,    Pulmonary exam normal breath sounds clear to auscultation       Cardiovascular negative cardio ROS Normal cardiovascular exam Rhythm:Regular Rate:Normal     Neuro/Psych negative neurological ROS  negative psych ROS   GI/Hepatic negative GI ROS, Neg liver ROS,   Endo/Other  negative endocrine ROS  Renal/GU negative Renal ROS  negative genitourinary   Musculoskeletal negative musculoskeletal ROS (+)   Abdominal   Peds  Hematology negative hematology ROS (+) hct 37.5, plt 208   Anesthesia Other Findings   Reproductive/Obstetrics (+) Pregnancy                             Anesthesia Physical Anesthesia Plan  ASA: II and emergent  Anesthesia Plan: Epidural   Post-op Pain Management:    Induction:   PONV Risk Score and Plan: 2  Airway Management Planned: Natural Airway  Additional Equipment: None  Intra-op Plan:   Post-operative Plan:   Informed Consent: I have reviewed the patients History and Physical, chart, labs and discussed the procedure including the risks, benefits and alternatives for the proposed anesthesia with the patient or authorized representative who has indicated his/her understanding and acceptance.       Plan Discussed with:   Anesthesia Plan Comments:         Anesthesia Quick Evaluation

## 2019-10-25 NOTE — Anesthesia Procedure Notes (Signed)
Epidural Patient location during procedure: OB Start time: 10/25/2019 6:42 AM End time: 10/25/2019 6:55 AM  Staffing Anesthesiologist: Lannie Fields, DO Performed: anesthesiologist   Preanesthetic Checklist Completed: patient identified, IV checked, risks and benefits discussed, monitors and equipment checked, pre-op evaluation and timeout performed  Epidural Patient position: sitting Prep: DuraPrep and site prepped and draped Patient monitoring: continuous pulse ox, blood pressure, heart rate and cardiac monitor Approach: midline Location: L3-L4 Injection technique: LOR air  Needle:  Needle type: Tuohy  Needle gauge: 17 G Needle length: 9 cm Needle insertion depth: 7 cm Catheter type: closed end flexible Catheter size: 19 Gauge Catheter at skin depth: 12 cm Test dose: negative  Assessment Sensory level: T8 Events: blood not aspirated, injection not painful, no injection resistance, no paresthesia and negative IV test  Additional Notes Patient identified. Risks/Benefits/Options discussed with patient including but not limited to bleeding, infection, nerve damage, paralysis, failed block, incomplete pain control, headache, blood pressure changes, nausea, vomiting, reactions to medication both or allergic, itching and postpartum back pain. Confirmed with bedside nurse the patient's most recent platelet count. Confirmed with patient that they are not currently taking any anticoagulation, have any bleeding history or any family history of bleeding disorders. Patient expressed understanding and wished to proceed. All questions were answered. Sterile technique was used throughout the entire procedure. Please see nursing notes for vital signs. Test dose was given through epidural catheter and negative prior to continuing to dose epidural or start infusion. Warning signs of high block given to the patient including shortness of breath, tingling/numbness in hands, complete motor  block, or any concerning symptoms with instructions to call for help. Patient was given instructions on fall risk and not to get out of bed. All questions and concerns addressed with instructions to call with any issues or inadequate analgesia.  Reason for block:procedure for pain

## 2019-10-25 NOTE — Anesthesia Postprocedure Evaluation (Signed)
Anesthesia Post Note  Patient: Valerie Holmes  Procedure(s) Performed: AN AD HOC LABOR EPIDURAL     Patient location during evaluation: Mother Baby Anesthesia Type: Epidural Level of consciousness: awake and alert, oriented and patient cooperative Pain management: pain level controlled Vital Signs Assessment: post-procedure vital signs reviewed and stable Respiratory status: spontaneous breathing, respiratory function stable and nonlabored ventilation Cardiovascular status: stable and blood pressure returned to baseline Postop Assessment: no headache, no apparent nausea or vomiting, patient able to bend at knees, able to ambulate, adequate PO intake, no backache and epidural receding Anesthetic complications: no   No complications documented.  Last Vitals:  Vitals:   10/25/19 1043 10/25/19 1157  BP: (!) 83/65 (!) 101/48  Pulse: 86 98  Resp: 20 18  Temp: 37 C 36.8 C  SpO2: 98% 99%    Last Pain:  Vitals:   10/25/19 1157  TempSrc: Oral  PainSc:    Pain Goal: Patients Stated Pain Goal: 0 (10/25/19 0341)                 Verne Grain

## 2019-10-25 NOTE — Progress Notes (Signed)
1mg  of IV stadol given for pain control per order, few minutes later Dr. at bedside to place epidural, patient too drowsy and unable to sit for procedure, will reevaulate when patient is more awake

## 2019-10-25 NOTE — MAU Note (Signed)
Covid swab obtained without difficulty and pt tol well. No symptoms 

## 2019-10-25 NOTE — Lactation Note (Addendum)
This note was copied from a baby's chart. Lactation Consultation Note  Patient Name: Valerie Holmes [id number] Date: 10/25/2019 Reason for consult: Initial assessment;Early term 37-38.6wks  Initial visit to 62 hours old infant of a P2 mother with breastfeeding experience. Baby is sleeping in basinet upon arrival. Mother states breastfeeding is going well and does not reports any problems at this point. Mother requests a manual pump to supplement and guidelines of formula supplementation volume. Mother states she will supplement will formula as needed but her main goal is to breastfeed.   Reviewed with mother average size of a NB stomach. Encourage to follow babies' hunger and fullness cues. Reviewed importance to offer the breast 8 to 12 times in a 24-hour period for proper stimulation and to establish good milk supply.     Reviewed breastfeeding basics. Discussed milk coming to volume. Reviewed newborn behavior and expectations with mother and encouraged to contact [facility] for support, questions or concerns.  Provided guidelines of formula supplementation volume and LC brochure.   All questions answered at this time.    Maternal Data Formula Feeding for Exclusion: No Has patient been taught Hand Expression?: Yes Does the patient have breastfeeding experience prior to this delivery?: Yes  Feeding Feeding Type: Breast Fed  LATCH Score Latch: Repeated attempts needed to sustain latch, nipple held in mouth throughout feeding, stimulation needed to elicit sucking reflex.  Audible Swallowing: None  Type of Nipple: Everted at rest and after stimulation  Comfort (Breast/Nipple): Soft / non-tender  Hold (Positioning): No assistance needed to correctly position infant at breast.  LATCH Score: 7  Interventions Interventions: Breast feeding basics reviewed;Hand pump  Lactation Tools Discussed/Used WIC Program: Yes Pump Review: Setup, frequency, and cleaning Initiated by:: Liora Myles IBCLC Date initiated:: 10/25/19   Consult Status Consult Status: Follow-up Date: 10/26/19 Follow-up type: In-patient    [name] [name] 10/25/2019, 7:19 PM

## 2019-10-26 LAB — CBC
HCT: 34.1 % — ABNORMAL LOW (ref 36.0–46.0)
Hemoglobin: 11 g/dL — ABNORMAL LOW (ref 12.0–15.0)
MCH: 29.5 pg (ref 26.0–34.0)
MCHC: 32.3 g/dL (ref 30.0–36.0)
MCV: 91.4 fL (ref 80.0–100.0)
Platelets: 183 10*3/uL (ref 150–400)
RBC: 3.73 MIL/uL — ABNORMAL LOW (ref 3.87–5.11)
RDW: 14 % (ref 11.5–15.5)
WBC: 13.3 10*3/uL — ABNORMAL HIGH (ref 4.0–10.5)
nRBC: 0 % (ref 0.0–0.2)

## 2019-10-26 MED ORDER — IBUPROFEN 800 MG PO TABS: 800.0000 mg | ORAL_TABLET | Freq: Three times a day (TID) | ORAL | 1 refills | Status: AC | PRN

## 2019-10-26 NOTE — Progress Notes (Signed)
Patient no longer wishes to be discharged PPD#1, will stay one more night (postpartum peds appt)

## 2019-10-26 NOTE — Social Work (Signed)
CSW met with MOB to provide carseat. MOB asked if carseat had a cover included, CSW stated no. MOB stated she thought it would have a cover and the carseat she has is not expired. CSW expressed understanding. MOB stated carseat is not needed.   Valerie Holmes, [place] Worker [facility] [employer]

## 2019-10-26 NOTE — Discharge Summary (Addendum)
Postpartum Discharge Summary  Date of Service updated     Patient Name: Valerie Holmes DOB: [birth year] MRN: [id number]  Date of admission: 10/25/2019 Delivery date:10/25/2019  Delivering provider: [facility]  Date of discharge: 10/27/2019  Admitting diagnosis: Normal labor [O80, Z37.9] Intrauterine pregnancy: [redacted]w[redacted]d    Secondary diagnosis:  Active Problems:   Normal labor  Additional problems: none    Discharge diagnosis: Term Pregnancy Delivered                                              Post partum procedures:none Augmentation: Pitocin Complications: None  Hospital course: Onset of Labor With Vaginal Delivery      29y.o. yo [id number] admitted in Latent Labor w/ PROM on 10/25/2019. Patient had an uncomplicated labor course as follows:  Membrane Rupture Time/Date: 2:41 AM ,10/25/2019   Delivery Method:Vaginal, Spontaneous  Episiotomy: None  Lacerations:  None  Patient had an uncomplicated postpartum course.  She is ambulating, tolerating a regular diet, passing flatus, and urinating well. Patient is discharged home in stable condition on 10/27/19.  Newborn Data: Birth date:10/25/2019  Birth time:8:06 AM  Gender:Female  Living status:Living  Apgars:8 ,9  Weight:2920 g   Magnesium Sulfate received: No BMZ received: No Rhophylac:No MMR:No T-DaP:Given prenatally Flu: No Transfusion:No  Physical exam  Vitals:   10/26/19 1431 10/26/19 1431 10/26/19 2125 10/27/19 0516  BP: 117/71 117/71 120/69 114/69  Pulse: 77 77 80 87  Resp: '18 18 16 18  ' Temp: 98.3 F (36.8 C) 98.3 F (36.8 C)  97.9 F (36.6 C)  TempSrc: Oral Oral Oral Oral  SpO2:   100%   Weight:      Height:       General: alert, cooperative and no distress Lochia: appropriate Uterine Fundus: firm Incision: N/A DVT Evaluation: No evidence of DVT seen on physical exam. Negative Homan's sign. No cords or calf tenderness. Labs: Lab Results  Component Value Date   WBC 13.3 (H)  10/26/2019   HGB 11.0 (L) 10/26/2019   HCT 34.1 (L) 10/26/2019   MCV 91.4 10/26/2019   PLT 183 10/26/2019   CMP Latest Ref Rng & Units 08/09/2019  Glucose 70 - 99 mg/dL 106(H)  BUN 6 - 20 mg/dL 7  Creatinine 0.44 - 1.00 mg/dL 0.49  Sodium 135 - 145 mmol/L 135  Potassium 3.5 - 5.1 mmol/L 3.6  Chloride 98 - 111 mmol/L 104  CO2 22 - 32 mmol/L 21(L)  Calcium 8.9 - 10.3 mg/dL 9.4   Edinburgh Score: Edinburgh Postnatal Depression Scale Screening Tool 10/26/2019  I have been able to laugh and see the funny side of things. 0  I have looked forward with enjoyment to things. 1  I have blamed myself unnecessarily when things went wrong. 2  I have been anxious or worried for no good reason. 2  I have felt scared or panicky for no good reason. 1  Things have been getting on top of me. 2  I have been so unhappy that I have had difficulty sleeping. 1  I have felt sad or miserable. 1  I have been so unhappy that I have been crying. 0  The thought of harming myself has occurred to me. 0  Edinburgh Postnatal Depression Scale Total 10      After visit meds:  Allergies as of 10/27/2019  No Known Allergies     Medication List    TAKE these medications   ibuprofen 800 MG tablet Commonly known as: ADVIL Take 1 tablet (800 mg total) by mouth every 8 (eight) hours as needed.   prenatal multivitamin Tabs tablet Take 1 tablet by mouth daily at 12 noon.        Discharge home in stable condition Infant Feeding: Breast Infant Disposition:home with mother Discharge instruction: per After Visit Summary and Postpartum booklet. Activity: Advance as tolerated. Pelvic rest for 6 weeks.  Diet: routine diet Anticipated Birth Control: Unsure Postpartum Appointment:6 weeks Future Appointments:No future appointments. Follow up Visit:      10/27/2019 Deliah Boston, MD

## 2019-10-26 NOTE — Social Work (Signed)
CSW received consult due to score 10 on Edinburgh Depression Screen.    CSW met with MOB to complete assessment and provide support. CSW introduced self and role. CSW asked MOB if she would like FOB to leave the room for privacy reasons, MOB declined. CSW informed MOB of reason for consult, referencing Edinburgh score. CSW asked MOB how she is feeling. MOB stated she is feeling happy. CSW asked MOB if she is experiencing any anxiousness, MOB stated no. CSW asked MOB if she has any mental health history, MOB stated no. MOB declined experiencing any SI, or HI. MOB declined any DV.   CSW provided education regarding Baby Blues vs PMADs and provided MOB with resources for mental health follow up.  CSW encouraged MOB to evaluate her mental health throughout the postpartum period with the use of the New Mom Checklist developed by Postpartum [facility] as well as the Edinburgh Postnatal Depression Scale and notify a medical professional if symptoms arise.    CSW provided review of Sudden Infant Death Syndrome (SIDS) precautions. CSW explained that baby should not co-sleep with anyone. CSW asked MOB where baby will sleep. MOB stated baby will sleep in a basinet in parent's room.   CSW asked MOB if she has all of the essential items to care for baby. MOB stated she could use some diapers and wipes. MOB also asked for a carseat, stating the one they have is expired. CSW informed MOB that a carseat can be purchased for $30. FOB agreed. CSW requested [facility] to follow-up with MOB for baby items. CSW informed MOB she can contact WIC to get assistance with formula. MOB stated baby will receive follow-up care at a [facility] on [address], but could not recall the exact name. MOB denies any transportation barriers.   MOB denies needing any additional resources and stated she is feeling good overall. CSW identifies no further need for intervention and no barriers to discharge at this time.  Valerie Holmes,  LCSWA Clinical Social Worker [facility] 

## 2019-10-26 NOTE — Lactation Note (Signed)
This note was copied from a baby's chart. Lactation Consultation Note  Patient Name: Valerie Holmes [id number] Date: 10/26/2019 Reason for consult: Follow-up assessment  LC Follow Up Visit:  Attempted to visit with mother, however, she was asleep.  Will have night shift LC follow up.   Maternal Data    Feeding    LATCH Score                   Interventions    Lactation Tools Discussed/Used     Consult Status Consult Status: Follow-up Date: 10/27/19 Follow-up type: In-patient    Amritpal Shropshire R Catheline Hixon 10/26/2019, 4:01 PM

## 2019-10-26 NOTE — Progress Notes (Signed)
POSTPARTUM PROGRESS NOTE  Post Partum Day #1  Subjective:  No acute events overnight.  Pt denies problems with ambulating, voiding or po intake.  She denies nausea or vomiting.  Pain is well controlled.   Lochia Minimal.   Objective: Blood pressure 105/60, pulse 82, temperature 98.3 F (36.8 C), temperature source Oral, resp. rate 18, height 5\' 7"  (1.702 m), weight 72 kg, SpO2 99 %, unknown if currently breastfeeding.  Physical Exam:  General: alert, cooperative and no distress Lochia:normal flow Chest: CTAB Heart: RRR no m/r/g Abdomen: +BS, soft, nontender Uterine Fundus: firm, 2cm below umbilicus Extremities: neg edema, neg calf TTP BL, neg Homans BL  Recent Labs    10/25/19 0401 10/26/19 0522  HGB 12.6 11.0*  HCT 37.5 34.1*    Assessment/Plan:  ASSESSMENT: Valerie Holmes is a 29 y.o. 26 s/p SVD @ [redacted]w[redacted]d. PNC essentially uncomplicated.   Discharge home, Breastfeeding and Circumcision prior to discharge   LOS: 1 day

## 2019-10-27 NOTE — Lactation Note (Signed)
This note was copied from a baby's chart. Lactation Consultation Note  Patient Name: Valerie Holmes Date: 10/27/2019 Reason for consult: Follow-up assessment;Early term 37-38.6wks  Follow up visit to 43 hours old infant with 5.99% weight loss of P2 mother with breastfeeding experience. Per mother, infant was breastfeeding for ~60 minutes, left breast. Infant seems comfortable and sucks rhythmically. Educated mom about deep latch to improve breastfeeding session, massaging breast while breastfeeding and how to keep baby awake during session. Discouraged lengthy BF sessions due to energy consumption. Mother verbalized understanding. Mother denies pain or problems with BF. Mother states infant is having good output.     Discussed engorgement signs, what to expect with milk coming in and how to use ice and massage for relief. Reviewed hunger and fullness cues, signs of intake, feeding 8-12 times in 24 h period, normal newborn behavior and cluster feeding. Praised mother for her efforts and dedication.   Parents are looking forward to be discharged today. [facility] brochure provided and parents are aware to call lactation as needed.      Feeding Feeding Type: Breast Fed  LATCH Score Latch: Grasps breast easily, tongue down, lips flanged, rhythmical sucking.  Audible Swallowing: Spontaneous and intermittent  Type of Nipple: Everted at rest and after stimulation  Comfort (Breast/Nipple): Soft / non-tender  Hold (Positioning): No assistance needed to correctly position infant at breast.  LATCH Score: 10  Interventions Interventions: Breast feeding basics reviewed;Adjust position  Consult Status Consult Status: Complete Date: 10/27/19 Follow-up type: Call as needed    Tasheika Kitzmiller [name] 10/27/2019, 12:01 PM

## 2019-10-27 NOTE — Progress Notes (Signed)
CSW met with MOB and FOB to provide carseat. FOB paid for car seat as seat they brought in for baby was not appropriate per RN.  ° °Larkyn Greenberger D. Jamahl Lemmons, MSW, LCSW °Clinical Social Worker °[phone]  °

## 2019-10-27 NOTE — Progress Notes (Signed)
POSTPARTUM PROGRESS NOTE  Post Partum Day #2  Subjective:  No acute events overnight.  Pt denies problems with ambulating, voiding or po intake.  She denies nausea or vomiting.  Pain is well controlled.   Lochia Minimal.   Objective: Blood pressure 114/69, pulse 87, temperature 97.9 F (36.6 C), temperature source Oral, resp. rate 18, height 5\' 7"  (1.702 m), weight 72 kg, SpO2 100 %, unknown if currently breastfeeding.  Physical Exam:  General: alert, cooperative and no distress Lochia:normal flow Chest: CTAB Heart: RRR no m/r/g Abdomen: +BS, soft, nontender Uterine Fundus: firm, 2cm below umbilicus Extremities: neg edema, neg calf TTP BL, neg Homans BL  Recent Labs    10/25/19 0401 10/26/19 0522  HGB 12.6 11.0*  HCT 37.5 34.1*    Assessment/Plan:  ASSESSMENT: Valerie Holmes is a 29 y.o. 28 s/p SVD @ [redacted]w[redacted]d. PNC essentially uncomplicated.   Discharge home and Breastfeeding  Baby circumcised yesterday   LOS: 2 days

## 2019-10-31 ENCOUNTER — Inpatient Hospital Stay (HOSPITAL_COMMUNITY): Admit: 2019-10-31 | Payer: Medicaid Other | Admitting: Obstetrics and Gynecology

## 2020-06-04 ENCOUNTER — Ambulatory Visit: Payer: Medicaid Other

## 2020-06-04 ENCOUNTER — Other Ambulatory Visit: Payer: Self-pay

## 2020-12-20 IMAGING — US OBSTETRIC <14 WK US AND TRANSVAGINAL OB US
1 series · 15 of 28 positions shown · non-contrast
Comparison: None.

CLINICAL DATA: Pelvic pain and vaginal bleeding

EXAM:
OBSTETRIC <14 WK US AND TRANSVAGINAL OB US
TECHNIQUE: Both transabdominal and transvaginal ultrasound examinations were
performed for complete evaluation of the gestation as well as the
maternal uterus, adnexal regions, and pelvic cul-de-sac.
Transvaginal technique was performed to assess early pregnancy.

[Series 1: obstetric <14 wk us and transvaginal ob us · 15 of 46 slices shown]
[im 1/46]
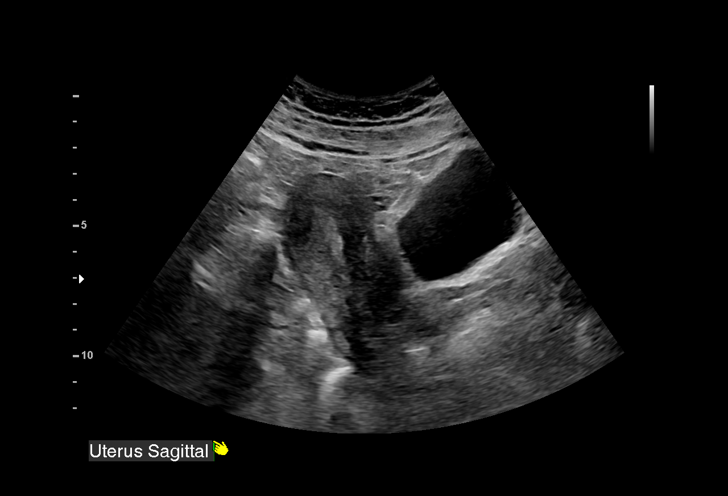
[im 4/46]
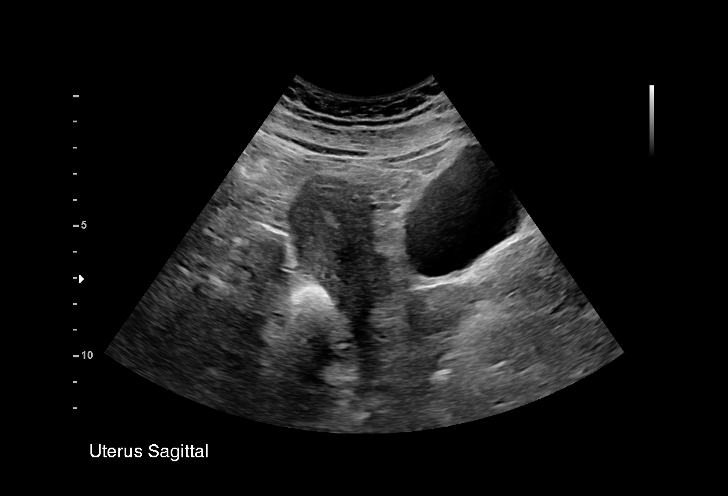
[im 7/46]
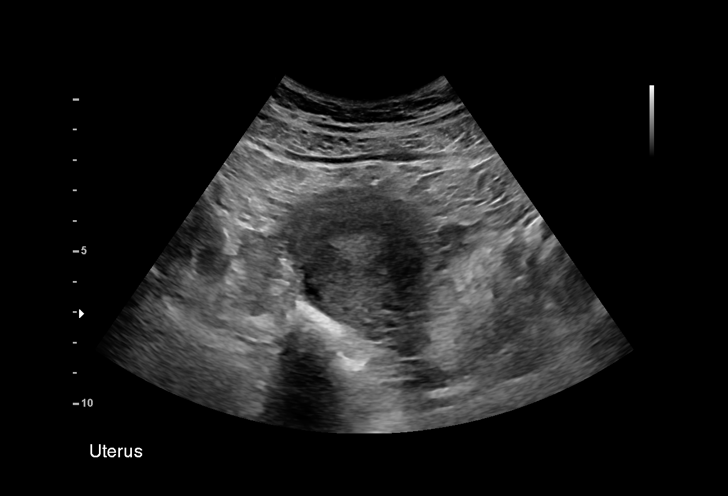
[im 11/46]
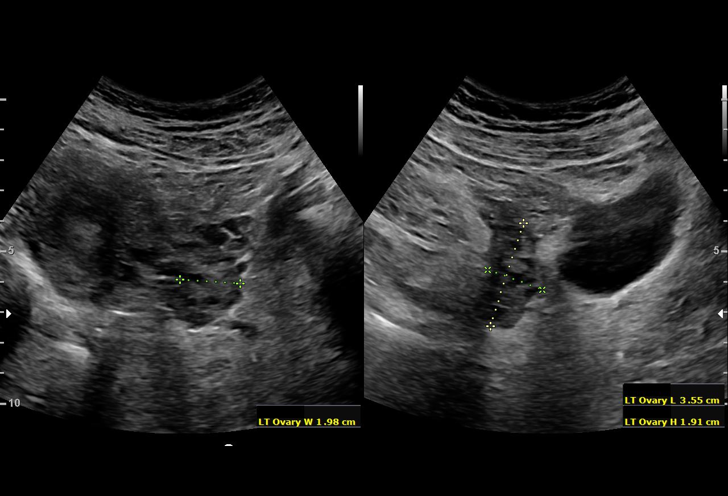
[im 14/46]
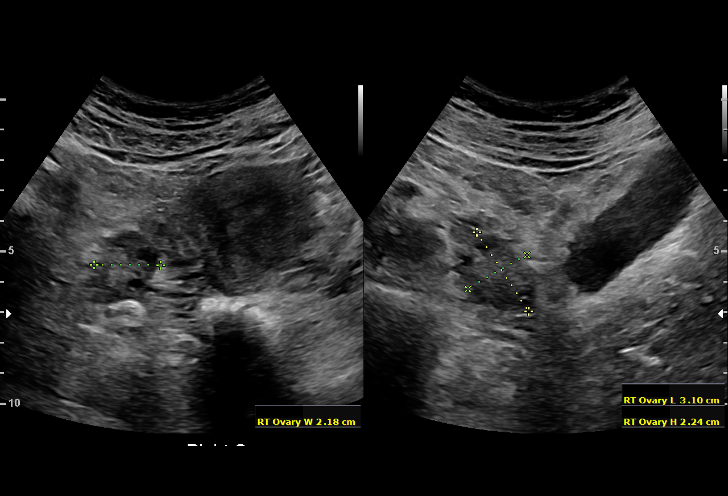
[im 17/46]
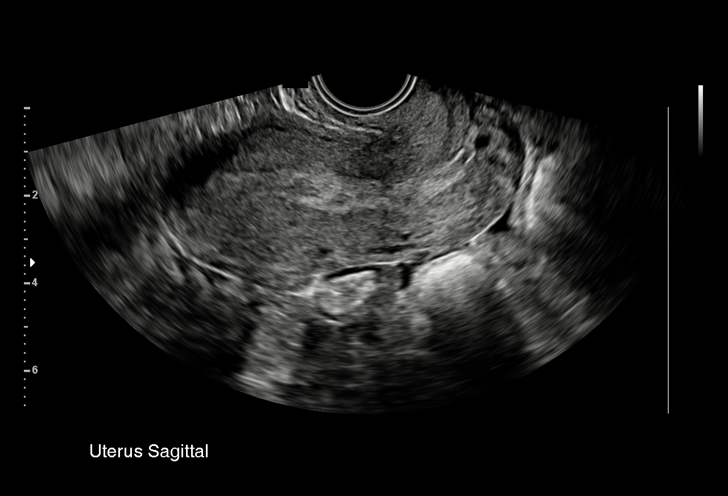
[im 21/46]
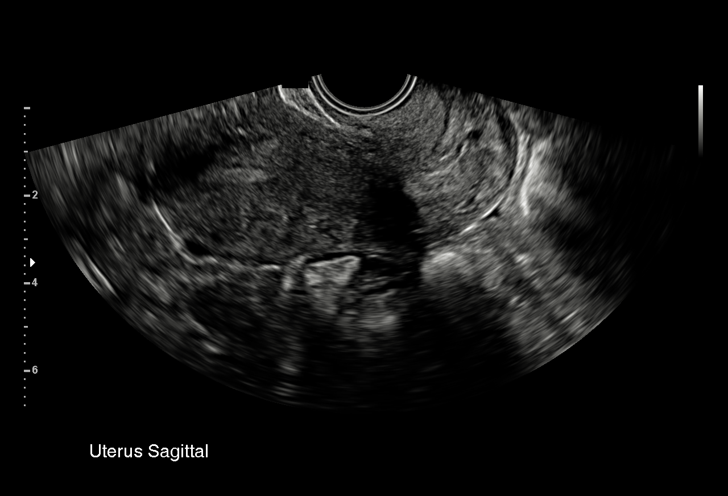
[im 24/46]
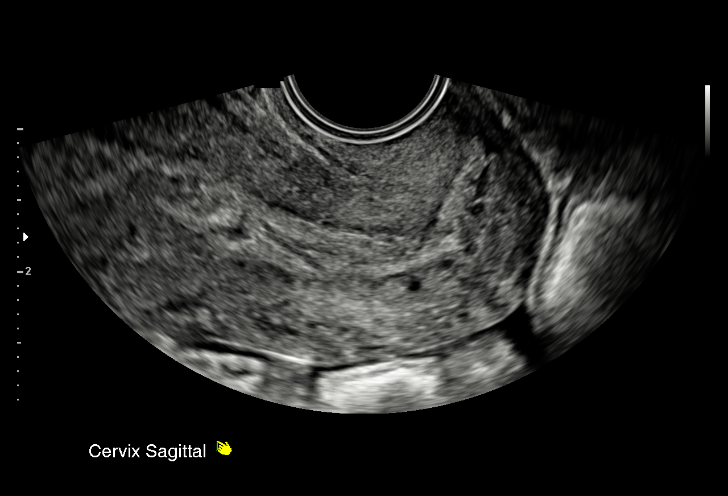
[im 26/46]
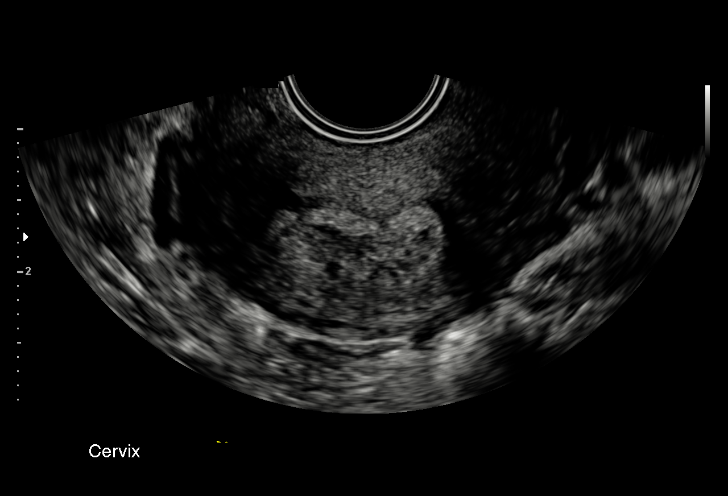
[im 29/46]
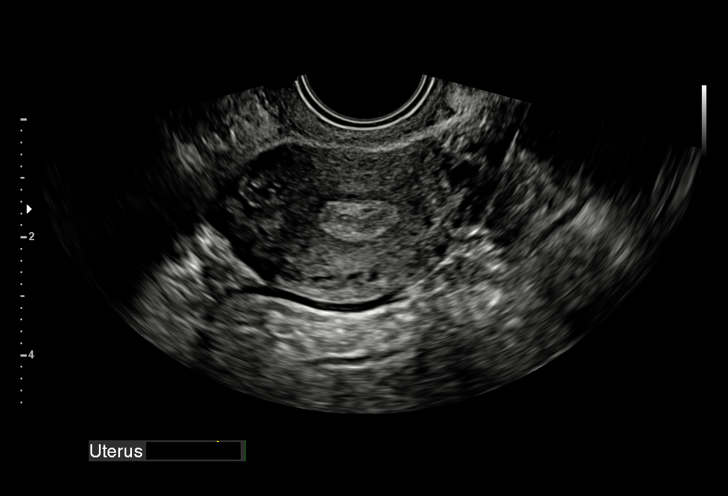
[im 32/46]
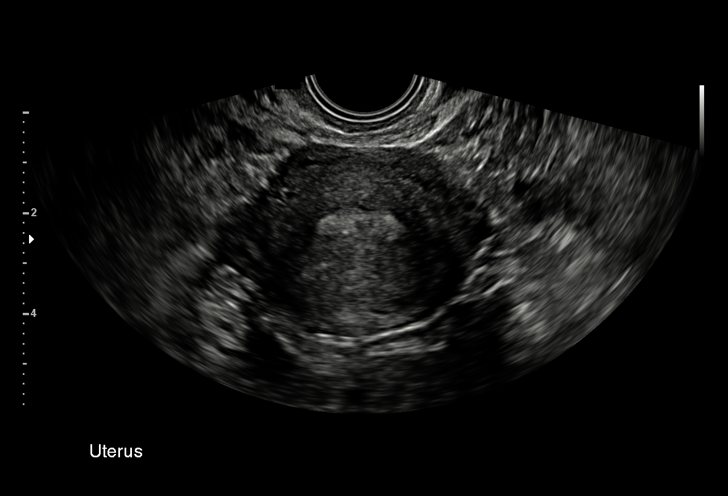
[im 36/46]
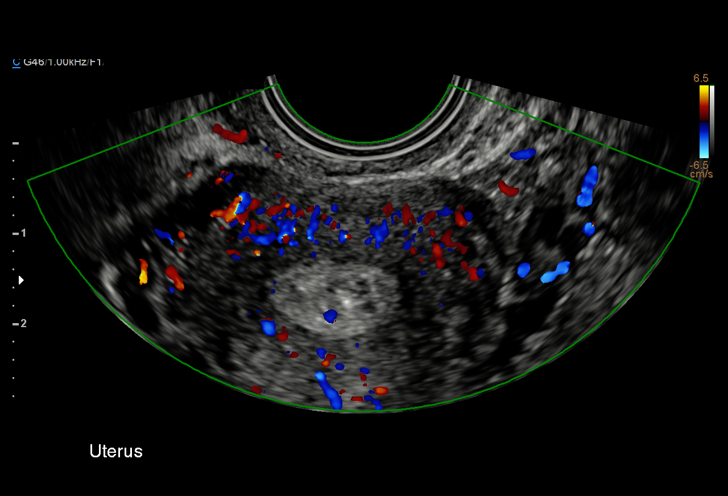
[im 39/46]
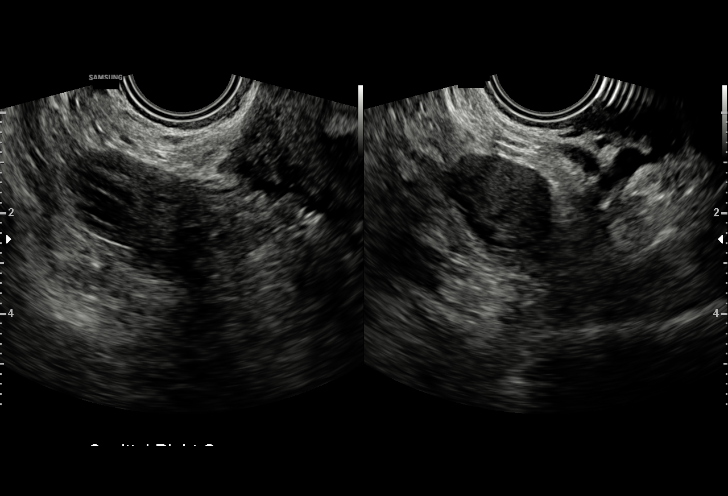
[im 42/46]
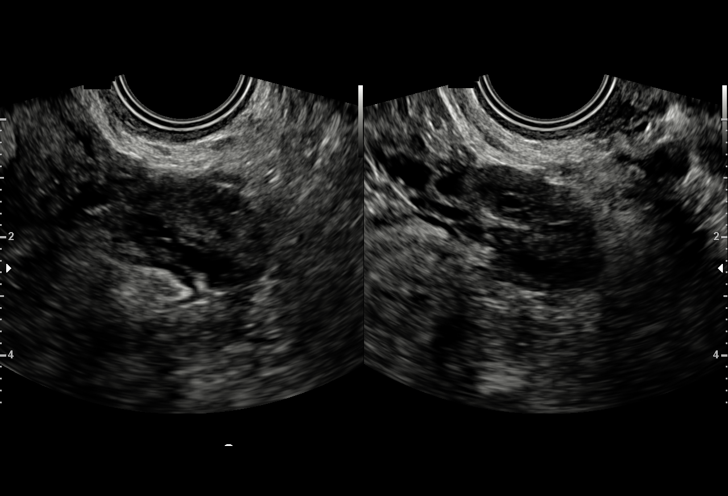
[im 46/46]
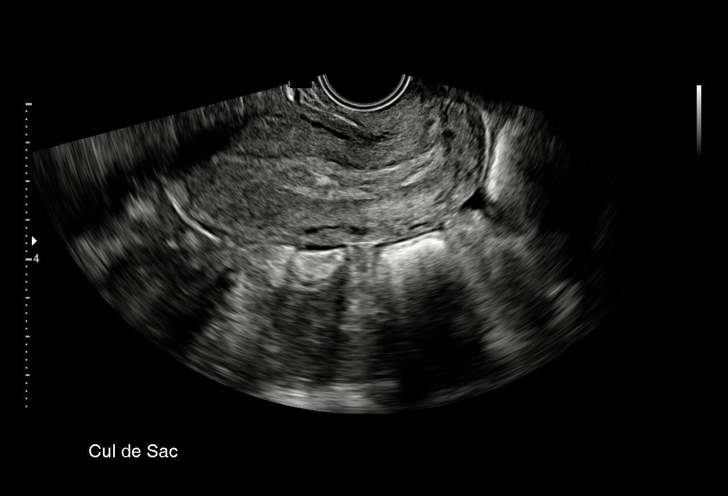

[15 of 28 positions shown; findings below may reference images not displayed]

FINDINGS: Intrauterine gestational sac: Not visualized

Yolk sac:  Not visualized

Embryo:  Not visualized

Cardiac Activity: Not visualized

Subchorionic hemorrhage:  None visualized.

Maternal uterus/adnexae: The endometrium measures 10 mm in thickness
with a smooth contour. Cervical os is closed. The right ovary
measures 3.2 x 2.1 x 2.1 cm. Left ovary measures 3.2 x 1.8 x 2.0 cm.
No extrauterine pelvic or adnexal mass. No appreciable free fluid
evident.
IMPRESSION: No intrauterine gestation is evident. Assuming positive beta HCG
value, differential considerations in this circumstance include
intrauterine gestation too early to be seen by either transabdominal
or transvaginal technique; recent spontaneous abortion; possible
ectopic gestation. This circumstance warrants close clinical and
laboratory surveillance. Timing of repeat ultrasound in large part
will depend on beta HCG values going forward.

Study otherwise unremarkable.

## 2022-03-05 IMAGING — DX DG CHEST 2V
2 series · 2 of 2 positions shown · non-contrast
Comparison: None.

CLINICAL DATA: Chest pain.

EXAM:
CHEST - 2 VIEW

[chest pa]
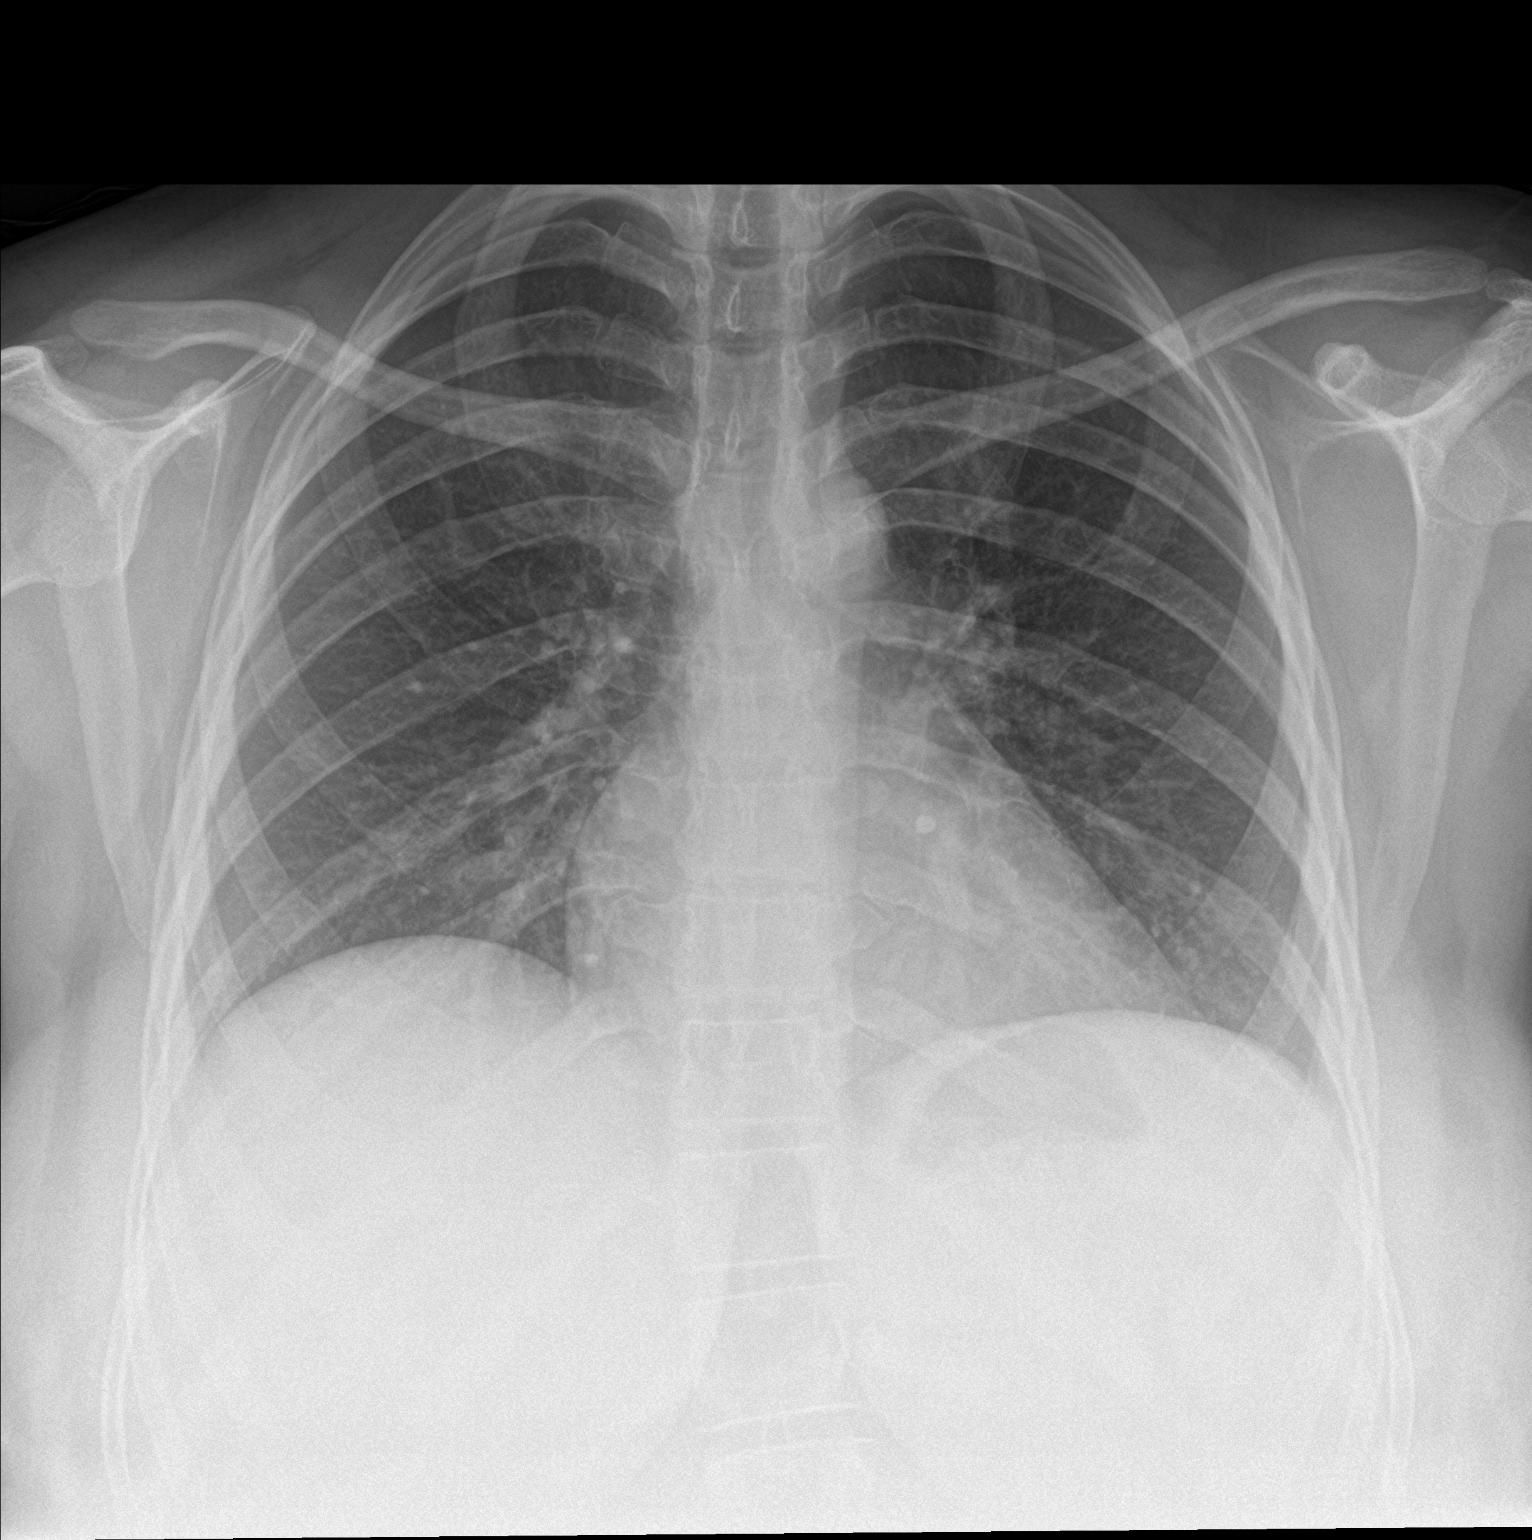

[chest lat]
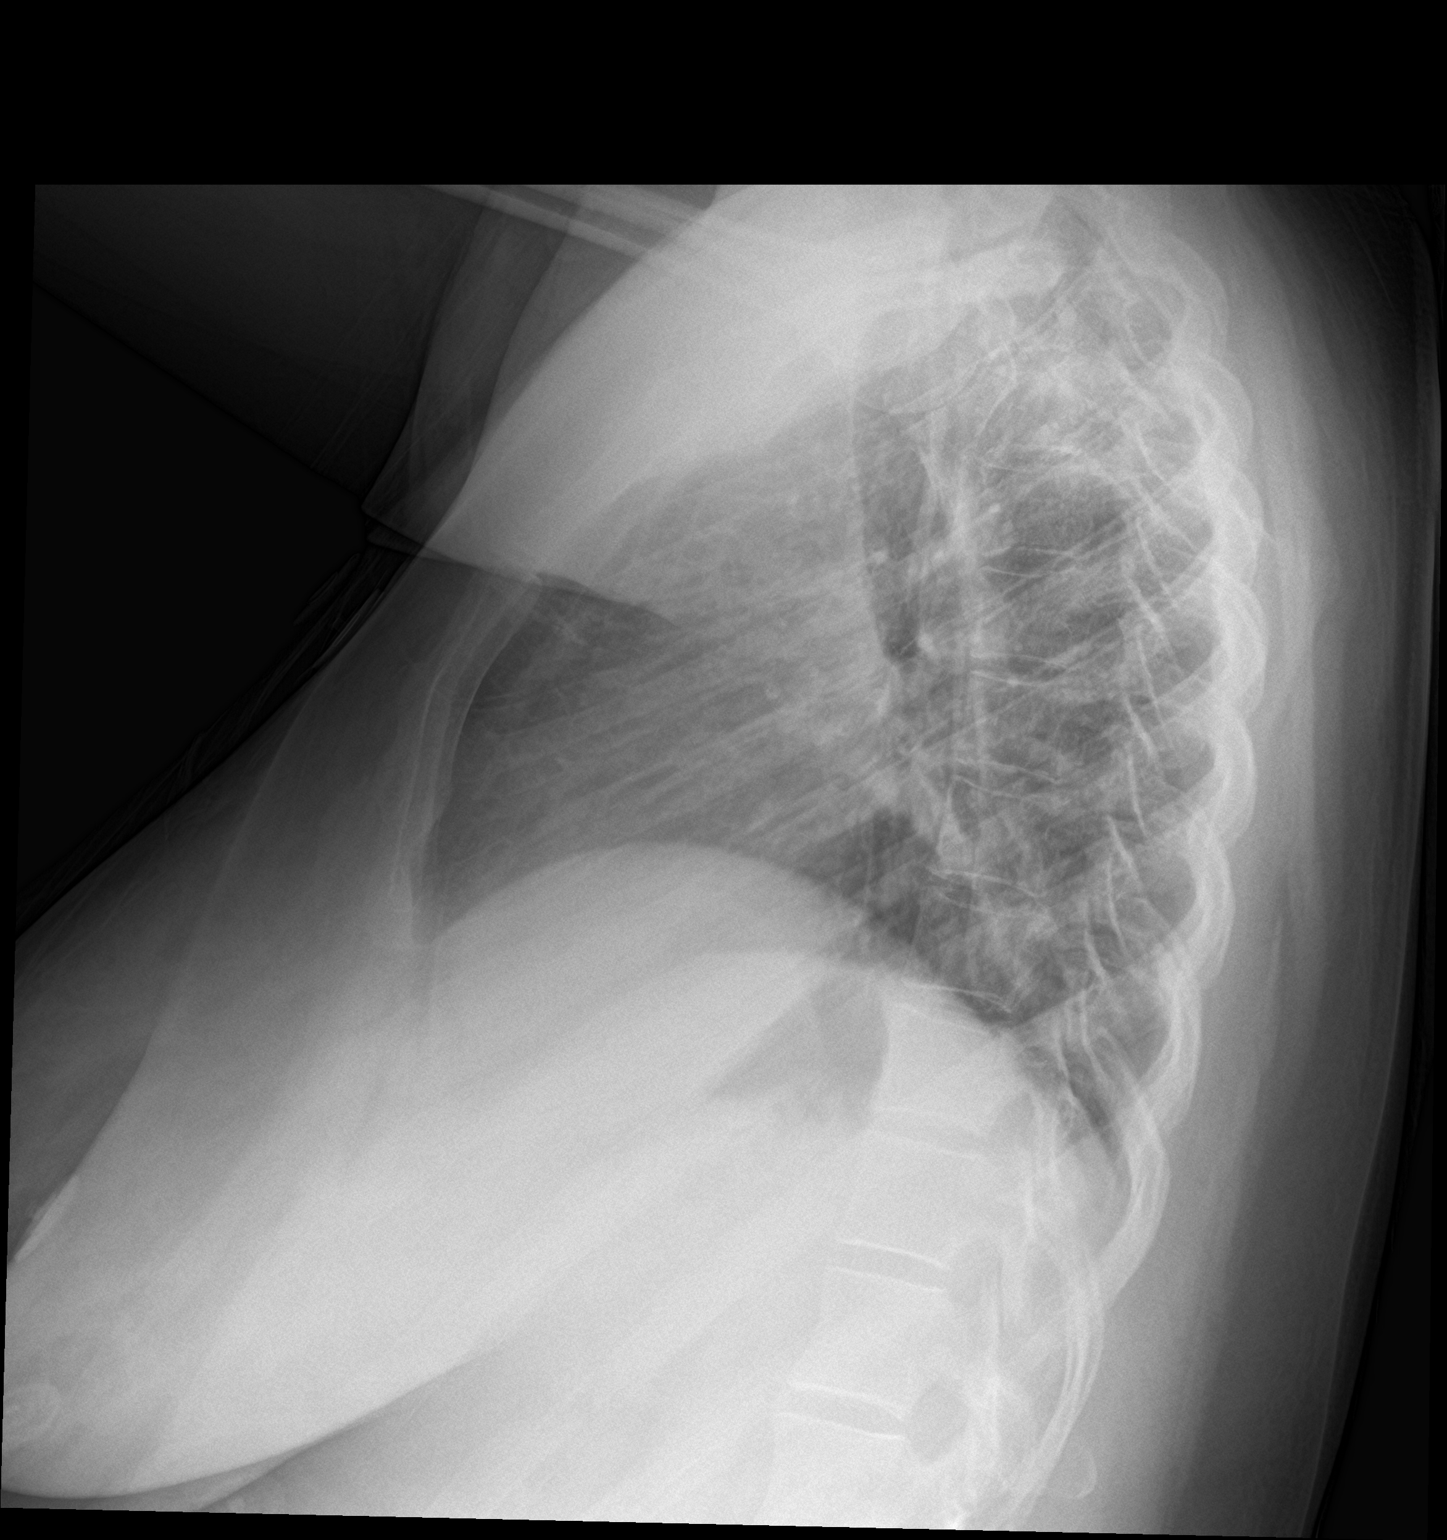

[2 of 2 positions shown; findings below may reference images not displayed]

FINDINGS: The heart size and mediastinal contours are within normal limits.
Both lungs are clear. The visualized skeletal structures are
unremarkable.
IMPRESSION: No active cardiopulmonary disease.
